# Patient Record
Sex: Female | Born: 1970 | Race: Black or African American | Hispanic: No | Marital: Married | Smoking: Never smoker
Health system: Southern US, Community
[De-identification: ages and names within clinical notes are randomized; demographics above are authoritative.]

## PROBLEM LIST (undated history)

## (undated) DIAGNOSIS — D259 Leiomyoma of uterus, unspecified: Secondary | ICD-10-CM

## (undated) DIAGNOSIS — E559 Vitamin D deficiency, unspecified: Secondary | ICD-10-CM

## (undated) HISTORY — DX: Vitamin D deficiency, unspecified: E55.9

## (undated) HISTORY — DX: Leiomyoma of uterus, unspecified: D25.9

---

## 2010-03-01 ENCOUNTER — Emergency Department (HOSPITAL_COMMUNITY): Admission: EM | Admit: 2010-03-01 | Discharge: 2010-03-01 | Payer: Self-pay | Admitting: Emergency Medicine

## 2010-09-04 LAB — URINE MICROSCOPIC-ADD ON

## 2010-09-04 LAB — URINALYSIS, ROUTINE W REFLEX MICROSCOPIC
Glucose, UA: NEGATIVE mg/dL
Ketones, ur: NEGATIVE mg/dL
pH: 6.5 (ref 5.0–8.0)

## 2010-09-04 LAB — RPR: RPR Ser Ql: NONREACTIVE

## 2010-10-14 ENCOUNTER — Emergency Department (HOSPITAL_COMMUNITY)
Admission: EM | Admit: 2010-10-14 | Discharge: 2010-10-14 | Disposition: A | Discharge status: Home / Self Care | Payer: Self-pay | Attending: Emergency Medicine | Admitting: Emergency Medicine

## 2010-10-14 DIAGNOSIS — N946 Dysmenorrhea, unspecified: Secondary | ICD-10-CM | POA: Insufficient documentation

## 2010-10-14 DIAGNOSIS — R109 Unspecified abdominal pain: Secondary | ICD-10-CM | POA: Insufficient documentation

## 2010-10-14 LAB — URINE MICROSCOPIC-ADD ON

## 2010-10-14 LAB — URINALYSIS, ROUTINE W REFLEX MICROSCOPIC
Nitrite: NEGATIVE
Specific Gravity, Urine: 1.025 (ref 1.005–1.030)
Urobilinogen, UA: 0.2 mg/dL (ref 0.0–1.0)

## 2010-10-14 LAB — POCT PREGNANCY, URINE: Preg Test, Ur: NEGATIVE

## 2010-10-20 ENCOUNTER — Encounter: Payer: Self-pay | Admitting: Obstetrics & Gynecology

## 2012-06-22 DIAGNOSIS — D259 Leiomyoma of uterus, unspecified: Secondary | ICD-10-CM

## 2012-06-22 HISTORY — DX: Leiomyoma of uterus, unspecified: D25.9

## 2013-01-26 ENCOUNTER — Ambulatory Visit: Payer: Self-pay

## 2013-09-26 DIAGNOSIS — E559 Vitamin D deficiency, unspecified: Secondary | ICD-10-CM

## 2013-09-26 HISTORY — DX: Vitamin D deficiency, unspecified: E55.9

## 2013-10-03 ENCOUNTER — Ambulatory Visit (INDEPENDENT_AMBULATORY_CARE_PROVIDER_SITE_OTHER): Payer: 59 | Admitting: Women's Health

## 2013-10-03 ENCOUNTER — Encounter: Payer: Self-pay | Admitting: Women's Health

## 2013-10-03 VITALS — BP 140/100 | Ht 63.0 in | Wt 216.0 lb

## 2013-10-03 DIAGNOSIS — N926 Irregular menstruation, unspecified: Secondary | ICD-10-CM

## 2013-10-03 DIAGNOSIS — D219 Benign neoplasm of connective and other soft tissue, unspecified: Secondary | ICD-10-CM

## 2013-10-03 DIAGNOSIS — Z01419 Encounter for gynecological examination (general) (routine) without abnormal findings: Secondary | ICD-10-CM

## 2013-10-03 DIAGNOSIS — D259 Leiomyoma of uterus, unspecified: Secondary | ICD-10-CM

## 2013-10-03 NOTE — Patient Instructions (Signed)
Fibroids Fibroids are lumps (tumors) that can occur any place in a woman's body. These lumps are not cancerous. Fibroids vary in size, weight, and where they grow. HOME CARE  Do not take aspirin.  Write down the number of pads or tampons you use during your period. Tell your doctor. This can help determine the best treatment for you. GET HELP RIGHT AWAY IF:  You have pain in your lower belly (abdomen) that is not helped with medicine.  You have cramps that are not helped with medicine.  You have more bleeding between or during your period.  You feel lightheaded or pass out (faint).  Your lower belly pain gets worse. MAKE SURE YOU:  Understand these instructions.  Will watch your condition.  Will get help right away if you are not doing well or get worse. Document Released: 07/11/2010 Document Revised: 08/31/2011 Document Reviewed: 07/11/2010 ExitCare Patient Information 2014 [employer].  

## 2013-10-03 NOTE — Progress Notes (Signed)
Erin Glenn 1971-02-16 [id number]    History:    Presents for problem and annual exam. History of normal Paps and baseline mammogram. Has had problems with irregular periods and amenorrhea. Reports numerous labs done at previous OB/GYN and was told it with a "fibroid causing the problem". Has regular monthly cycles when on birth control pills but would like to conceive, reports never conceiving when off OC's. Husband has 41 year old child. States had normal blood pressure, blood sugar, lipid panel, and CBC at primary care, will have labs and reports sent.   Past medical history, past surgical history, family history and social history were all reviewed and documented in the EPIC chart. Recently moved here from [place]. 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  Parents hypertension.   ROS:  A  ROS was performed and pertinent positives and negatives are included.  Exam:  Filed Vitals:   10/03/13 1447  BP: 140/100    General appearance:  Normal Thyroid:  Symmetrical, normal in size, without palpable masses or nodularity. Respiratory  Auscultation:  Clear without wheezing or rhonchi Cardiovascular  Auscultation:  Regular rate, without rubs, murmurs or gallops  Edema/varicosities:  Not grossly evident Abdominal  Soft,nontender, without masses, guarding or rebound.  Liver/spleen:  No organomegaly noted  Hernia:  None appreciated  Skin  Inspection:  Grossly normal   Breasts: Examined lying and sitting.     Right: Without masses, retractions, discharge or axillary adenopathy.     Left: Without masses, retractions, discharge or axillary adenopathy. Gentitourinary   Inguinal/mons:  Normal without inguinal adenopathy  External genitalia:  Normal  BUS/Urethra/Skene's glands:  Normal  Vagina:  Normal  Cervix:  Normal  Uterus:  8 weeks' size, shape and contour.  Midline and mobile  Adnexa/parametria:     Rt: Without masses or tenderness.   Lt: Without masses or  tenderness.  Anus and perineum: Normal  Digital rectal exam: Normal sphincter tone without palpated masses or tenderness  Assessment/Plan:  43 y.o. MBF G0 for annual exam.    Desiring conception History of fibroid  Obesity History of irregular cycles/amenorrhea  Name: Blood pressure elevated today, will check away from office, states normal last week and had labs at primary care. SBE's, annual screening mammogram, calcium rich diet, MVI daily encouraged. Schedule ultrasound to assess fibroids. Reviewed possibly irregular cycles causing more  Fertility issues than fibroids. Reviewed importance of increasing regular exercise and decreasing calories for weight loss. Pap. Will await lab and ultrasound reports from previous Dr.   Huel Cote [facility], 5:33 PM 10/03/2013

## 2013-10-04 ENCOUNTER — Other Ambulatory Visit (HOSPITAL_COMMUNITY)
Admission: RE | Admit: 2013-10-04 | Discharge: 2013-10-04 | Disposition: A | Discharge status: Home / Self Care | Payer: Self-pay | Source: Ambulatory Visit | Attending: Gynecology | Admitting: Gynecology

## 2013-10-04 DIAGNOSIS — Z01419 Encounter for gynecological examination (general) (routine) without abnormal findings: Secondary | ICD-10-CM | POA: Insufficient documentation

## 2013-10-04 NOTE — Addendum Note (Signed)
Addended by: Nelva Nay on: 10/04/2013 08:18 AM   Modules accepted: Orders

## 2013-10-11 ENCOUNTER — Ambulatory Visit (INDEPENDENT_AMBULATORY_CARE_PROVIDER_SITE_OTHER): Payer: 59 | Admitting: Women's Health

## 2013-10-11 ENCOUNTER — Other Ambulatory Visit: Payer: Self-pay | Admitting: Women's Health

## 2013-10-11 ENCOUNTER — Encounter: Payer: Self-pay | Admitting: Women's Health

## 2013-10-11 ENCOUNTER — Ambulatory Visit (INDEPENDENT_AMBULATORY_CARE_PROVIDER_SITE_OTHER): Payer: 59

## 2013-10-11 DIAGNOSIS — D252 Subserosal leiomyoma of uterus: Secondary | ICD-10-CM

## 2013-10-11 DIAGNOSIS — N852 Hypertrophy of uterus: Secondary | ICD-10-CM

## 2013-10-11 DIAGNOSIS — D219 Benign neoplasm of connective and other soft tissue, unspecified: Secondary | ICD-10-CM

## 2013-10-11 DIAGNOSIS — D259 Leiomyoma of uterus, unspecified: Secondary | ICD-10-CM | POA: Insufficient documentation

## 2013-10-11 DIAGNOSIS — D251 Intramural leiomyoma of uterus: Secondary | ICD-10-CM

## 2013-10-11 MED ORDER — NORETHIN ACE-ETH ESTRAD-FE 1-20 MG-MCG PO TABS: 1.0000 | ORAL_TABLET | Freq: Every day | ORAL | Status: DC

## 2013-10-11 NOTE — Patient Instructions (Signed)
Fibroids Fibroids are lumps (tumors) that can occur any place in a woman's body. These lumps are not cancerous. Fibroids vary in size, weight, and where they grow. HOME CARE  Do not take aspirin.  Write down the number of pads or tampons you use during your period. Tell your doctor. This can help determine the best treatment for you. GET HELP RIGHT AWAY IF:  You have pain in your lower belly (abdomen) that is not helped with medicine.  You have cramps that are not helped with medicine.  You have more bleeding between or during your period.  You feel lightheaded or pass out (faint).  Your lower belly pain gets worse. MAKE SURE YOU:  Understand these instructions.  Will watch your condition.  Will get help right away if you are not doing well or get worse. Document Released: 07/11/2010 Document Revised: 08/31/2011 Document Reviewed: 07/11/2010 ExitCare Patient Information 2014 [employer].  

## 2013-10-11 NOTE — Progress Notes (Signed)
Patient ID: Erin Glenn, female   DOB: March 13, 1971, 43 y.o.   MRN: [id number] Presents for ultrasound, history of asymptomatic fibroids. Married last year and contemplating pregnancy. Irregular cycles, OC's for cycle control in the past. Normal TSH and prolactin.  Ultrasound: Enlarged anteverted fibroid uterus 72 x 60 x 86 mm unable to ID endometrium, 35 x 28 x 37 mm subserous, 29 x 26 x 24 mm. Endometrium fluid-filled lower endometrium upper endometrium not visualized. Right ovary normal. Left ovary transabdominal image normal negative cul-de-sac.  Large Fibroid uterus.  Plan: Options reviewed, myomectomy versus hysterectomy. Discussed need to be out of work, care after  and states cannot be out greater than one to 2 weeks. At this time she would like to proceed with birth control pills, will try Loestrin 1/20 prescription, proper use given and reviewed, instructed to call if no cycle. Start first day of next cycle. Risks of blood clots and strokes reviewed. Instructed to call if would like to proceed to schedule appointment with Dr. Phineas Real to discuss options.

## 2013-11-24 ENCOUNTER — Telehealth: Payer: Self-pay | Admitting: *Deleted

## 2013-11-24 MED ORDER — NORETHIN-ETH ESTRAD-FE BIPHAS 1 MG-10 MCG / 10 MCG PO TABS: 1.0000 | ORAL_TABLET | Freq: Every day | ORAL | Status: DC

## 2013-11-24 NOTE — Telephone Encounter (Signed)
Pt was placed Loestrin 1/20 on 10/11/13 has stopped taking pills c/o dizziness while on them. No dizzy spells now, she asked if lower dose pill could be sent. Please advise

## 2013-11-24 NOTE — Telephone Encounter (Signed)
Could try lol oestrin fe 24, tell her to start Sunday after next cycle daily, reviewed if a 10 mcg estrogen. First month contraceptive,

## 2013-11-24 NOTE — Telephone Encounter (Signed)
Pt informed, rx sent 

## 2013-11-28 ENCOUNTER — Other Ambulatory Visit: Payer: Self-pay | Admitting: Women's Health

## 2013-11-28 ENCOUNTER — Telehealth: Payer: Self-pay

## 2013-11-28 MED ORDER — NORETHINDRONE 0.35 MG PO TABS: 1.0000 | ORAL_TABLET | Freq: Every day | ORAL | Status: DC

## 2013-11-28 NOTE — Telephone Encounter (Signed)
Pharmacy sent a message requesting "New Rx for Gildess FE, Microgestin FE or Junel FE per insurance". The Rx that they have is Lo Loestrin FE Tabs-28.

## 2013-11-28 NOTE — Telephone Encounter (Signed)
Telephone call to pt concerning pharmacy call.  States no longer takiing pills, made her dizzy, has large fibroids and amenorrhea, will try micronor, rx called in

## 2014-07-21 ENCOUNTER — Emergency Department (HOSPITAL_BASED_OUTPATIENT_CLINIC_OR_DEPARTMENT_OTHER)
Admission: EM | Admit: 2014-07-21 | Discharge: 2014-07-21 | Disposition: A | Discharge status: Home / Self Care | Payer: Self-pay | Attending: Emergency Medicine | Admitting: Emergency Medicine

## 2014-07-21 ENCOUNTER — Encounter (HOSPITAL_BASED_OUTPATIENT_CLINIC_OR_DEPARTMENT_OTHER): Payer: Self-pay | Admitting: Emergency Medicine

## 2014-07-21 ENCOUNTER — Emergency Department (HOSPITAL_BASED_OUTPATIENT_CLINIC_OR_DEPARTMENT_OTHER): Payer: Self-pay

## 2014-07-21 DIAGNOSIS — Z8639 Personal history of other endocrine, nutritional and metabolic disease: Secondary | ICD-10-CM | POA: Insufficient documentation

## 2014-07-21 DIAGNOSIS — B349 Viral infection, unspecified: Secondary | ICD-10-CM | POA: Insufficient documentation

## 2014-07-21 DIAGNOSIS — Z793 Long term (current) use of hormonal contraceptives: Secondary | ICD-10-CM | POA: Insufficient documentation

## 2014-07-21 DIAGNOSIS — Z8742 Personal history of other diseases of the female genital tract: Secondary | ICD-10-CM | POA: Insufficient documentation

## 2014-07-21 MED ORDER — ACETAMINOPHEN 325 MG PO TABS
ORAL_TABLET | ORAL | Status: AC
  Filled 2014-07-21: qty 2

## 2014-07-21 MED ORDER — ACETAMINOPHEN 325 MG PO TABS
650.0000 mg | ORAL_TABLET | Freq: Once | ORAL | Status: AC
  Administered 2014-07-21: 650 mg via ORAL

## 2014-07-21 NOTE — ED Provider Notes (Signed)
CSN: [id number]     Arrival date & time 07/21/14  0848 History   First MD Initiated Contact with Patient 07/21/14 [phone]     Chief Complaint  Patient presents with  . Cough  . Facial Pain     (Consider location/radiation/quality/duration/timing/severity/associated sxs/prior Treatment) HPI  Pt presenting with c/o headache, as well as fever.  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  She has some mild cough.  Frontal headache is throbbing.  Denies having nasal congestion or sore throat.  She states she has been taking theraflu and mucinex at home.  Has continued to drink liquids well.  No abodminal pain, no vomiting/diarrhea.  No dysuria.  She states her face feels hot due to having fever.  There are no other associated systemic symptoms, there are no other alleviating or modifying factors.   Past Medical History  Diagnosis Date  . Unspecified vitamin D deficiency 09/26/2013    [facility] care [facility]  . Fibroid uterus 2014   History reviewed. No pertinent past surgical history. Family History  Problem Relation Age of Onset  . Diabetic kidney disease Mother   . Hypertension Mother   . Alcoholism Father   . Hypertension Father   . Cancer - Other Father 15    throat   History  Substance Use Topics  . Smoking status: Never Smoker   . Smokeless tobacco: Not on file  . Alcohol Use: No   OB History    No data available     Review of Systems  ROS reviewed and all otherwise negative except for mentioned in HPI    Allergies  Review of patient's allergies indicates no known allergies.  Home Medications   Prior to Admission medications   Medication Sig Start Date End Date Taking? Authorizing Provider  norethindrone (ORTHO MICRONOR) 0.35 MG tablet Take 1 tablet (0.35 mg total) by mouth daily. 11/28/13   Huel Cote, NP   BP 123/73 mmHg  Pulse 100  Temp(Src) 100.2 F (37.9 C) (Oral)  Resp 20  Ht 5\' 4"  (1.626 m)  Wt 220 lb (99.791  kg)  BMI 37.74 kg/m2  SpO2 95%  LMP 07/07/2014 (Approximate)  Vitals reviewed Physical Exam  Physical Examination: General appearance - alert, well appearing, and in no distress Mental status - alert, oriented to person, place, and time Eyes - no conjunctival injection, no scleral icterus Mouth - mucous membranes moist, pharynx normal without lesions Neck - supple, no significant adenopathy Chest - clear to auscultation, no wheezes, rales or rhonchi, symmetric air entry Heart - normal rate, regular rhythm, normal S1, S2, no murmurs, rubs, clicks or gallops Extremities - peripheral pulses normal, no pedal edema, no clubbing or cyanosis Skin - normal coloration and turgor, no rashes  ED Course  Procedures (including critical care time) Labs Review Labs Reviewed - No data to display  Imaging Review Dg Chest 2 View  07/21/2014   CLINICAL DATA:  44 year old female with chest pain, cough and fever since Thursday.  EXAM: CHEST  2 VIEW  COMPARISON:  No priors.  FINDINGS: Lung volumes are normal. No consolidative airspace disease. No pleural effusions. No pneumothorax. No pulmonary nodule or mass noted. Pulmonary vasculature and the cardiomediastinal silhouette are within normal limits. Externum No radiographic evidence of acute cardiopulmonary disease.  IMPRESSION: No active cardiopulmonary disease.   Electronically Signed   By: Vinnie Langton M.D.   On: 07/21/2014 09:17     EKG Interpretation None  MDM   Final diagnoses:  Viral infection   Pt presenting with cough, frontal headache, with fever.  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  Pt has likely viral infection.  No signs of serious bacterial infection at this time.  CXR reassuring.   Xray images reviewed and interpreted by me as well. Nursing notes including past medical history and social history reviewed and considered in documentation.  Discharged with strict return precautions.  Pt  agreeable with plan.      Threasa Beards, MD 07/21/14 1310

## 2014-07-21 NOTE — ED Notes (Signed)
Patient states that she has facial pain with cough and fever. The patient states that her client recently was treated for a cold. The patient reports that she has taken multiple OTC medication to help

## 2014-07-21 NOTE — Discharge Instructions (Signed)
Return to the ED with any concerns including difficulty breathing, vomiting and not able to keep down liquids, decreased level of alertness/lethargy, or any other alarming symptoms °

## 2015-07-09 ENCOUNTER — Ambulatory Visit (INDEPENDENT_AMBULATORY_CARE_PROVIDER_SITE_OTHER): Payer: BLUE CROSS/BLUE SHIELD | Admitting: Family Medicine

## 2015-07-09 VITALS — BP 142/100 | HR 82 | Temp 98.3°F | Resp 16 | Ht 64.0 in | Wt 245.0 lb

## 2015-07-09 DIAGNOSIS — R002 Palpitations: Secondary | ICD-10-CM

## 2015-07-09 DIAGNOSIS — Z131 Encounter for screening for diabetes mellitus: Secondary | ICD-10-CM | POA: Diagnosis not present

## 2015-07-09 DIAGNOSIS — R42 Dizziness and giddiness: Secondary | ICD-10-CM | POA: Diagnosis not present

## 2015-07-09 DIAGNOSIS — R635 Abnormal weight gain: Secondary | ICD-10-CM

## 2015-07-09 DIAGNOSIS — I10 Essential (primary) hypertension: Secondary | ICD-10-CM | POA: Diagnosis not present

## 2015-07-09 LAB — POCT GLYCOSYLATED HEMOGLOBIN (HGB A1C): Hemoglobin A1C: 6

## 2015-07-09 LAB — POCT CBC
Granulocyte percent: 64.9 %G (ref 37–80)
HCT, POC: 36 % — AB (ref 37.7–47.9)
Hemoglobin: 12.2 g/dL (ref 12.2–16.2)
Lymph, poc: 2.7 (ref 0.6–3.4)
MCH, POC: 25.3 pg — AB (ref 27–31.2)
MCHC: 33.8 g/dL (ref 31.8–35.4)
MCV: 74.8 fL — AB (ref 80–97)
MID (cbc): 0.3 (ref 0–0.9)
MPV: 7.7 fL (ref 0–99.8)
POC Granulocyte: 5.6 (ref 2–6.9)
POC LYMPH PERCENT: 31.3 % (ref 10–50)
POC MID %: 3.8 % (ref 0–12)
Platelet Count, POC: 316 10*3/uL (ref 142–424)
RBC: 4.81 M/uL (ref 4.04–5.48)
RDW, POC: 18.5 %
WBC: 8.6 10*3/uL (ref 4.6–10.2)

## 2015-07-09 MED ORDER — HYDROCHLOROTHIAZIDE 25 MG PO TABS: ORAL_TABLET | ORAL | Status: DC

## 2015-07-09 NOTE — Progress Notes (Signed)
Chief Complaint:  Chief Complaint  Patient presents with  . Hypertension  . Dizziness    HPI: Erin Glenn is a 45 y.o. female who reports to [facility] today complaining of  htn and dizziness. She was on hctz 12.5 mg daily. NO CP , but had palpitations x 1  She deneis HA, vision changes, n/v/abd pain, n/w/t , CVA like sxs, gait changes.  She was workign out and was on the ellipitical and treadmill and felt dizzy. No hx of vertigo or dizziness She has not been on her BP medication on HCTZ 12.5 mg since she lost weigth, she is taking dietary supplent garcia combodgia which is new, denies caffeine or phentermine use of any anabolic steroids. She was doing well with weight loss and then gained it back  She deos have anemai and has  Uterine fobroids, LMP was light last month, and also short. However before than was heavy and lasted 5 days  Denies any yrianry sxs, no fevers , chills, syuria or back pain   Past Medical History  Diagnosis Date  . Unspecified vitamin D deficiency 09/26/2013    urgent care at [facility]  . Fibroid uterus 2014   History reviewed. No pertinent past surgical history. Social History   Social History  . Marital Status: Single    Spouse Name: N/A  . Number of Children: N/A  . Years of Education: N/A   Social History Main Topics  . Smoking status: Never Smoker   . Smokeless tobacco: None  . Alcohol Use: No  . Drug Use: No  . Sexual Activity: Yes    Birth Control/ Protection: None     Comment: G0 P0   Other Topics Concern  . None   Social History Narrative   Family History  Problem Relation Age of Onset  . Diabetic kidney disease Mother   . Hypertension Mother   . Alcoholism Father   . Hypertension Father   . Cancer - Other Father 69    throat   No Known Allergies Prior to Admission medications   Medication Sig Start Date End Date Taking? Authorizing Provider  norethindrone (ORTHO MICRONOR) 0.35 MG tablet Take 1 tablet (0.35 mg total)  by mouth daily. Patient not taking: Reported on 07/09/2015 11/28/13   Huel Cote, NP     ROS: The patient denies fevers, chills, night sweats, unintentional weight loss, chest pain, palpitations, wheezing, dyspnea on exertion, nausea, vomiting, abdominal pain, dysuria, hematuria, melena, numbness, weakness, or tingling.   All other systems have been reviewed and were otherwise negative with the exception of those mentioned in the HPI and as above.    PHYSICAL EXAM: Filed Vitals:   07/09/15 2017  BP: 142/100  Pulse: 82  Temp: 98.3 F (36.8 C)  Resp: 16   Body mass index is 42.03 kg/(m^2).   General: Alert, no acute distress HEENT:  Normocephalic, atraumatic, oropharynx patent. EOMI, PERRLA Fundo exam and Tm normal Cardiovascular:  Regular rate and rhythm, no rubs murmurs or gallops.  No Carotid bruits, radial pulse intact. No pedal edema.  Respiratory: Clear to auscultation bilaterally.  No wheezes, rales, or rhonchi.  No cyanosis, no use of accessory musculature Abdominal: No organomegaly, abdomen is soft and non-tender, positive bowel sounds. No masses. Skin: No rashes. Neurologic: Facial musculature symmetric. Cn 2-12 grossly nl, 5/5 sterngth, 2/2 DTRS, neg ROmberg, neg DI xHallpike Psychiatric: Patient acts appropriately throughout our interaction. Lymphatic: No cervical or submandibular lymphadenopathy Musculoskeletal: Gait intact. No edema,  tenderness   LABS: Results for orders placed or performed in visit on 07/09/15  POCT CBC  Result Value Ref Range   WBC 8.6 4.6 - 10.2 K/uL   Lymph, poc 2.7 0.6 - 3.4   POC LYMPH PERCENT 31.3 10 - 50 %L   MID (cbc) 0.3 0 - 0.9   POC MID % 3.8 0 - 12 %M   POC Granulocyte 5.6 2 - 6.9   Granulocyte percent 64.9 37 - 80 %G   RBC 4.81 4.04 - 5.48 M/uL   Hemoglobin 12.2 12.2 - 16.2 g/dL   HCT, POC 36.0 (A) 37.7 - 47.9 %   MCV 74.8 (A) 80 - 97 fL   MCH, POC 25.3 (A) 27 - 31.2 pg   MCHC 33.8 31.8 - 35.4 g/dL   RDW, POC 18.5 %    Platelet Count, POC 316 142 - 424 K/uL   MPV 7.7 0 - 99.8 fL     EKG/XRAY:   Primary read interpreted by Dr. Marin Comment at [facility].   ASSESSMENT/PLAN: Encounter Diagnoses  Name Primary?  . Essential hypertension Yes  . Palpitations   . Weight gain    Labs pending, orthostatics normal  Ekg showed nonspecific changes Fu with labs, refer to cardiology prn She will stop diet supplements, monitor sxs, precautions given to go to ER prn    Gross sideeffects, risk and benefits, and alternatives of medications d/w patient. Patient is aware that all medications have potential sideeffects and we are unable to predict every sideeffect or drug-drug interaction that may occur.  Jolanda Mccann DO  07/09/2015 9:21 PM

## 2015-07-10 LAB — COMPLETE METABOLIC PANEL WITHOUT GFR
ALT: 19 U/L (ref 6–29)
AST: 18 U/L (ref 10–30)
BUN: 9 mg/dL (ref 7–25)
CO2: 26 mmol/L (ref 20–31)
Calcium: 9.4 mg/dL (ref 8.6–10.2)
Chloride: 103 mmol/L (ref 98–110)
Creat: 0.65 mg/dL (ref 0.50–1.10)
Glucose, Bld: 93 mg/dL (ref 65–99)
Sodium: 138 mmol/L (ref 135–146)

## 2015-07-10 LAB — COMPLETE METABOLIC PANEL WITH GFR
Albumin: 3.9 g/dL (ref 3.6–5.1)
Alkaline Phosphatase: 70 U/L (ref 33–115)
GFR, Est African American: >89 mL/min (ref >=60)
GFR, Est Non African American: >89 mL/min (ref >=60)
Potassium: 4 mmol/L (ref 3.5–5.3)
Total Bilirubin: 0.2 mg/dL (ref 0.2–1.2)
Total Protein: 7.3 g/dL (ref 6.1–8.1)

## 2015-07-10 LAB — TSH: TSH: 3.179 u[IU]/mL (ref 0.350–4.500)

## 2015-07-11 ENCOUNTER — Telehealth: Payer: Self-pay

## 2015-07-11 NOTE — Telephone Encounter (Signed)
Pt called requesting lab results.  Call [phone]

## 2015-07-12 NOTE — Telephone Encounter (Signed)
Please review and send to lab pool.

## 2015-07-13 DIAGNOSIS — I10 Essential (primary) hypertension: Secondary | ICD-10-CM | POA: Insufficient documentation

## 2015-07-13 NOTE — Telephone Encounter (Signed)
Spoke with patient, will add Vit D level. She had appt with ob/gyn for fibroids next week will discuss. BP has been good highest was 136/90. But prior to that running around mid 120s/70s. She will try getting on elliptical and see if dizzy We did not collect urine last itme since she did not have dysuria or back pain, n/v. She has stopped taking garcinia cambodgia and dizziness seems to be slightly better. Recheck prn . Decline referral to cardiology at this time. Will let me know.

## 2015-08-03 ENCOUNTER — Telehealth: Payer: Self-pay

## 2015-08-03 NOTE — Telephone Encounter (Signed)
Pt has been taking hydrochlorothiazide (HYDRODIURIL) 25 MG tablet as prescribed by Dr. Marin Comment. She states she has had a hard time keeping her blood pressure under control for the last few weeks, so she has been taking two of the pills daily; As a result, the pt is completely out of this medication and needs refills. She would also like to try a higher dose.  bp range over last few weeks 170/101-140/90

## 2015-08-05 ENCOUNTER — Telehealth: Payer: Self-pay

## 2015-08-05 NOTE — Telephone Encounter (Signed)
Pt is checking on her blood pressure medication request that she states was on a thurdsay and only information I see is for 08/03/15 Pt is very anxoius to get this prescription   Best number [phone]

## 2015-08-06 MED ORDER — HYDROCHLOROTHIAZIDE 25 MG PO TABS: ORAL_TABLET | ORAL | Status: DC

## 2015-08-06 MED ORDER — LISINOPRIL 10 MG PO TABS: 10.0000 mg | ORAL_TABLET | Freq: Every day | ORAL | Status: DC

## 2015-08-06 NOTE — Telephone Encounter (Signed)
Spoke wit patient, her BP is not terrible, she has high distaloic, going through menopause, has fibroids but 2/3 are shrinking. Will get recent labs from ob gyn sent to [place], she had normal CBC and cmp from last ov. She denies dizziness, CP . Will rx lisnopril 10 mg daily and also hctz 12.5 to 25 mg daily. She iwll monitor BP . Fu in 2-4 weeks.

## 2015-08-06 NOTE — Telephone Encounter (Signed)
Pt called again 2/13 anxious about getting rx.  Dr. Marin Comment, please advise.

## 2015-10-10 ENCOUNTER — Telehealth: Payer: Self-pay

## 2015-10-10 NOTE — Telephone Encounter (Signed)
Patient is calling to request a refill for lasix generic.   [facility] [place]

## 2015-10-11 ENCOUNTER — Ambulatory Visit (INDEPENDENT_AMBULATORY_CARE_PROVIDER_SITE_OTHER): Payer: BLUE CROSS/BLUE SHIELD | Admitting: Family Medicine

## 2015-10-11 VITALS — BP 110/60 | HR 73 | Temp 97.8°F | Resp 14 | Ht 64.0 in | Wt 244.2 lb

## 2015-10-11 DIAGNOSIS — R600 Localized edema: Secondary | ICD-10-CM | POA: Diagnosis not present

## 2015-10-11 MED ORDER — FUROSEMIDE 20 MG PO TABS: 20.0000 mg | ORAL_TABLET | Freq: Every day | ORAL | Status: DC

## 2015-10-11 NOTE — Patient Instructions (Addendum)
[facility] in [place], [place]  Dr. Linus Mako for the vein therapy on [address].    IF you received an x-ray today, you will receive an invoice from [facility]. Please contact [facility] at [phone] with questions or concerns regarding your invoice.   IF you received labwork today, you will receive an invoice from [facility] [employer]. Please contact [facility] at [phone] with questions or concerns regarding your invoice.   Our billing staff will not be able to assist you with questions regarding bills from these companies.  You will be contacted with the lab results as soon as they are available. The fastest way to get your results is to activate your My Chart account. Instructions are located on the last page of this paperwork. If you have not heard from [place] regarding the results in 2 weeks, please contact this office.

## 2015-10-11 NOTE — Progress Notes (Signed)
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  She complains of pedal edema.  She is working out and doing [employer].  Objective:  NAD BP 110/60 mmHg  Pulse 73  Temp(Src) 97.8 F (36.6 C) (Oral)  Resp 14  Ht 5\' 4"  (1.626 m)  Wt 244 lb 3.2 oz (110.768 kg)  BMI 41.90 kg/m2  SpO2 97% 1+ pedal edema Results for orders placed or performed in visit on 07/09/15  COMPLETE METABOLIC PANEL WITH GFR  Result Value Ref Range   Sodium 138 135 - 146 mmol/L   Potassium 4.0 3.5 - 5.3 mmol/L   Chloride 103 98 - 110 mmol/L   CO2 26 20 - 31 mmol/L   Glucose, Bld 93 65 - 99 mg/dL   BUN 9 7 - 25 mg/dL   Creat 0.65 0.50 - 1.10 mg/dL   Total Bilirubin 0.2 0.2 - 1.2 mg/dL   Alkaline Phosphatase 70 33 - 115 U/L   AST 18 10 - 30 U/L   ALT 19 6 - 29 U/L   Total Protein 7.3 6.1 - 8.1 g/dL   Albumin 3.9 3.6 - 5.1 g/dL   Calcium 9.4 8.6 - 10.2 mg/dL   GFR, Est African American >89 >=60 mL/min   GFR, Est Non African American >89 >=60 mL/min  TSH  Result Value Ref Range   TSH 3.179 0.350 - 4.500 uIU/mL  POCT CBC  Result Value Ref Range   WBC 8.6 4.6 - 10.2 K/uL   Lymph, poc 2.7 0.6 - 3.4   POC LYMPH PERCENT 31.3 10 - 50 %L   MID (cbc) 0.3 0 - 0.9   POC MID % 3.8 0 - 12 %M   POC Granulocyte 5.6 2 - 6.9   Granulocyte percent 64.9 37 - 80 %G   RBC 4.81 4.04 - 5.48 M/uL   Hemoglobin 12.2 12.2 - 16.2 g/dL   HCT, POC 36.0 (A) 37.7 - 47.9 %   MCV 74.8 (A) 80 - 97 fL   MCH, POC 25.3 (A) 27 - 31.2 pg   MCHC 33.8 31.8 - 35.4 g/dL   RDW, POC 18.5 %   Platelet Count, POC 316 142 - 424 K/uL   MPV 7.7 0 - 99.8 fL  POCT glycosylated hemoglobin (Hb A1C)  Result Value Ref Range   Hemoglobin A1C 6.0      Assessment:  Mild pedal edema and obesity with spider veins on legs  Plan:  Pedal edema - Plan: furosemide (LASIX) 20 MG tablet  Robyn Haber, MD

## 2015-10-14 NOTE — Telephone Encounter (Signed)
Pt was seen

## 2015-11-12 ENCOUNTER — Ambulatory Visit (INDEPENDENT_AMBULATORY_CARE_PROVIDER_SITE_OTHER): Payer: BLUE CROSS/BLUE SHIELD | Admitting: Family Medicine

## 2015-11-12 VITALS — BP 122/70 | HR 70 | Temp 98.1°F | Resp 18 | Ht 64.0 in | Wt 243.0 lb

## 2015-11-12 DIAGNOSIS — Z111 Encounter for screening for respiratory tuberculosis: Secondary | ICD-10-CM

## 2015-11-12 NOTE — Progress Notes (Signed)

## 2015-11-12 NOTE — Patient Instructions (Signed)
     IF you received an x-ray today, you will receive an invoice from [facility]. Please contact [facility] at [phone] with questions or concerns regarding your invoice.   IF you received labwork today, you will receive an invoice from [facility]. Please contact [facility] at [phone] with questions or concerns regarding your invoice.   Our billing staff will not be able to assist you with questions regarding bills from these companies.  You will be contacted with the lab results as soon as they are available. The fastest way to get your results is to activate your My Chart account. Instructions are located on the last page of this paperwork. If you have not heard from us regarding the results in 2 weeks, please contact this office.      

## 2015-11-15 ENCOUNTER — Ambulatory Visit (INDEPENDENT_AMBULATORY_CARE_PROVIDER_SITE_OTHER): Payer: BLUE CROSS/BLUE SHIELD | Admitting: *Deleted

## 2015-11-15 DIAGNOSIS — Z111 Encounter for screening for respiratory tuberculosis: Secondary | ICD-10-CM

## 2015-11-15 LAB — TB SKIN TEST
INDURATION: 0 mm
TB Skin Test: NEGATIVE

## 2015-11-19 NOTE — Progress Notes (Signed)
   Subjective:    Patient ID: Erin Glenn, female    DOB: 11-14-1970, 45 y.o.   MRN: [id number] Chief Complaint  Patient presents with  . PPD Reading    tb placement    HPI Needs ppd for work. Past Medical History  Diagnosis Date  . Unspecified vitamin D deficiency 09/26/2013    urgent care at [facility]  . Fibroid uterus 2014   Current Outpatient Prescriptions on File Prior to Visit  Medication Sig Dispense Refill  . furosemide (LASIX) 20 MG tablet Take 1 tablet (20 mg total) by mouth daily. 30 tablet 3  . hydrochlorothiazide (HYDRODIURIL) 25 MG tablet Take 1/2 tab po daily , may increase to 1 tab daily if BP is >140/90 30 tablet 1   No current facility-administered medications on file prior to visit.   No Known Allergies     Review of Systems  Constitutional: Negative for fever, chills, diaphoresis, activity change, appetite change and fatigue.  Respiratory: Negative for cough and shortness of breath.   Cardiovascular: Negative for chest pain.  Hematological: Negative for adenopathy.       Objective:  BP 122/70 mmHg  Pulse 70  Temp(Src) 98.1 F (36.7 C) (Oral)  Resp 18  Ht 5\' 4"  (1.626 m)  Wt 243 lb (110.224 kg)  BMI 41.69 kg/m2  SpO2 95%  LMP 10/15/2015  Physical Exam  Constitutional: She is oriented to person, place, and time. She appears well-developed and well-nourished. No distress.  HENT:  Head: Normocephalic and atraumatic.  Right Ear: External ear normal.  Eyes: Conjunctivae are normal. No scleral icterus.  Pulmonary/Chest: Effort normal.  Neurological: She is alert and oriented to person, place, and time.  Skin: Skin is warm and dry. She is not diaphoretic. No erythema.  Psychiatric: She has a normal mood and affect. Her behavior is normal.          Assessment & Plan:   1. Screening for tuberculosis   for work. No h/o abnml   Orders Placed This Encounter  Procedures  . TB Skin Test    Order Specific Question:  Has patient ever  tested positive?    Answer:  No      Delman Cheadle, MD MPH

## 2015-12-24 ENCOUNTER — Emergency Department (HOSPITAL_BASED_OUTPATIENT_CLINIC_OR_DEPARTMENT_OTHER)
Admission: EM | Admit: 2015-12-24 | Discharge: 2015-12-24 | Disposition: A | Discharge status: Home / Self Care | Payer: Self-pay | Attending: Emergency Medicine | Admitting: Emergency Medicine

## 2015-12-24 ENCOUNTER — Encounter (HOSPITAL_BASED_OUTPATIENT_CLINIC_OR_DEPARTMENT_OTHER): Payer: Self-pay | Admitting: Emergency Medicine

## 2015-12-24 DIAGNOSIS — K029 Dental caries, unspecified: Secondary | ICD-10-CM | POA: Insufficient documentation

## 2015-12-24 MED ORDER — HYDROCODONE-ACETAMINOPHEN 5-325 MG PO TABS: 1.0000 | ORAL_TABLET | Freq: Four times a day (QID) | ORAL | Status: DC | PRN

## 2015-12-24 MED ORDER — PENICILLIN V POTASSIUM 500 MG PO TABS: 500.0000 mg | ORAL_TABLET | Freq: Three times a day (TID) | ORAL | Status: DC

## 2015-12-24 NOTE — ED Provider Notes (Signed)
CSN: [id number]     Arrival date & time 12/24/15  [id number] History   First MD Initiated Contact with Patient 12/24/15 [phone]     Chief Complaint  Patient presents with  . Dental Pain     (Consider location/radiation/quality/duration/timing/severity/associated sxs/prior Treatment) HPI Comments: Patient is a 45 year old female who presents with complaints of dental pain. She has multiple caries and is due to see her dentist tomorrow. The pain became worse and she presents for evaluation of this.  Patient is a 45 y.o. female presenting with tooth pain. The history is provided by the patient.  Dental Pain Location:  Lower Lower teeth location:  18/LL 2nd molar and 19/LL 1st molar Quality:  Throbbing Severity:  Moderate Onset quality:  Sudden Timing:  Constant Progression:  Worsening Chronicity:  Chronic Context: poor dentition     Past Medical History  Diagnosis Date  . Unspecified vitamin D deficiency 09/26/2013    urgent care at [facility]  . Fibroid uterus 2014   No past surgical history on file. Family History  Problem Relation Age of Onset  . Diabetic kidney disease Mother   . Hypertension Mother   . Alcoholism Father   . Hypertension Father   . Cancer - Other Father 79    throat   Social History  Substance Use Topics  . Smoking status: Never Smoker   . Smokeless tobacco: None  . Alcohol Use: No   OB History    No data available     Review of Systems  All other systems reviewed and are negative.     Allergies  Review of patient's allergies indicates no known allergies.  Home Medications   Prior to Admission medications   Medication Sig Start Date End Date Taking? Authorizing Provider  furosemide (LASIX) 20 MG tablet Take 1 tablet (20 mg total) by mouth daily. 10/11/15   Robyn Haber, MD  hydrochlorothiazide (HYDRODIURIL) 25 MG tablet Take 1/2 tab po daily , may increase to 1 tab daily if BP is >140/90 08/06/15   Thao P Le, DO   BP 130/95 mmHg  Pulse 74   Temp(Src) 98.6 F (37 C) (Oral)  Resp 16  Ht 5\' 4"  (1.626 m)  Wt 243 lb (110.224 kg)  BMI 41.69 kg/m2  SpO2 99%  LMP 11/24/2015 Physical Exam  Constitutional: She is oriented to person, place, and time. She appears well-developed and well-nourished. No distress.  HENT:  Head: Normocephalic and atraumatic.  There are multiple heavily decayed teeth. The left lower molars are mostly missing. There is some swelling and inflammation of the gingival tissue, however no obvious abscess. There is no crepitus or trismus.  Neck: Normal range of motion. Neck supple.  Neurological: She is alert and oriented to person, place, and time.  Skin: Skin is warm and dry. She is not diaphoretic.  Nursing note reviewed.   ED Course  Procedures (including critical care time) Labs Review Labs Reviewed - No data to display  Imaging Review No results found. I have personally reviewed and evaluated these images and lab results as part of my medical decision-making.   EKG Interpretation None      MDM   Final diagnoses:  None    We'll treat with antibiotics and pain meds. She is to see her dentist tomorrow as scheduled.    Veryl Speak, MD 12/24/15 [phone]

## 2015-12-24 NOTE — Discharge Instructions (Signed)
Penicillin as prescribed.  Hydrocodone as prescribed as needed for pain.  See your dentist tomorrow as scheduled.   Dental Caries Dental caries (also called tooth decay) is the most common oral disease. It can occur at any age but is more common in children and young adults.  HOW DENTAL CARIES DEVELOPS  The process of decay begins when bacteria and foods (particularly sugars and starches) combine in your mouth to produce plaque. Plaque is a substance that sticks to the hard, outer surface of a tooth (enamel). The bacteria in plaque produce acids that attack enamel. These acids may also attack the root surface of a tooth (cementum) if it is exposed. Repeated attacks dissolve these surfaces and create holes in the tooth (cavities). If left untreated, the acids destroy the other layers of the tooth.  RISK FACTORS  Frequent sipping of sugary beverages.   Frequent snacking on sugary and starchy foods, especially those that easily get stuck in the teeth.   Poor oral hygiene.   Dry mouth.   Substance abuse such as methamphetamine abuse.   Broken or poor-fitting dental restorations.   Eating disorders.   Gastroesophageal reflux disease (GERD).   Certain radiation treatments to the head and neck. SYMPTOMS In the early stages of dental caries, symptoms are seldom present. Sometimes white, chalky areas may be seen on the enamel or other tooth layers. In later stages, symptoms may include:  Pits and holes on the enamel.  Toothache after sweet, hot, or cold foods or drinks are consumed.  Pain around the tooth.  Swelling around the tooth. DIAGNOSIS  Most of the time, dental caries is detected during a regular dental checkup. A diagnosis is made after a thorough medical and dental history is taken and the surfaces of your teeth are checked for signs of dental caries. Sometimes special instruments, such as lasers, are used to check for dental caries. Dental X-ray exams may be taken  so that areas not visible to the eye (such as between the contact areas of the teeth) can be checked for cavities.  TREATMENT  If dental caries is in its early stages, it may be reversed with a fluoride treatment or an application of a remineralizing agent at the dental office. Thorough brushing and flossing at home is needed to aid these treatments. If it is in its later stages, treatment depends on the location and extent of tooth destruction:   If a small area of the tooth has been destroyed, the destroyed area will be removed and cavities will be filled with a material such as gold, silver amalgam, or composite resin.   If a large area of the tooth has been destroyed, the destroyed area will be removed and a cap (crown) will be fitted over the remaining tooth structure.   If the center part of the tooth (pulp) is affected, a procedure called a root canal will be needed before a filling or crown can be placed.   If most of the tooth has been destroyed, the tooth may need to be pulled (extracted). HOME CARE INSTRUCTIONS You can prevent, stop, or reverse dental caries at home by practicing good oral hygiene. Good oral hygiene includes:  Thoroughly cleaning your teeth at least twice a day with a toothbrush and dental floss.   Using a fluoride toothpaste. A fluoride mouth rinse may also be used if recommended by your dentist or health care provider.   Restricting the amount of sugary and starchy foods and sugary liquids you consume.  Avoiding frequent snacking on these foods and sipping of these liquids.   Keeping regular visits with a dentist for checkups and cleanings. PREVENTION   Practice good oral hygiene.  Consider a dental sealant. A dental sealant is a coating material that is applied by your dentist to the pits and grooves of teeth. The sealant prevents food from being trapped in them. It may protect the teeth for several years.  Ask about fluoride supplements if you live  in a community without fluorinated water or with water that has a low fluoride content. Use fluoride supplements as directed by your dentist or health care provider.  Allow fluoride varnish applications to teeth if directed by your dentist or health care provider.   This information is not intended to replace advice given to you by your health care provider. Make sure you discuss any questions you have with your health care provider.   Document Released: 02/28/2002 Document Revised: 06/29/2014 Document Reviewed: 06/10/2012 Elsevier Interactive Patient Education [employer].

## 2015-12-24 NOTE — ED Notes (Signed)
Left lower dental pain since yesterday, unable to see dentist until tomorrow.

## 2016-03-24 ENCOUNTER — Other Ambulatory Visit: Payer: Self-pay | Admitting: Family Medicine

## 2016-03-24 DIAGNOSIS — R600 Localized edema: Secondary | ICD-10-CM

## 2016-03-25 NOTE — Telephone Encounter (Signed)
Last exam 06/2015 with lab. 10/2015 with Dr Brigitte Pulse for TB screen

## 2016-03-27 NOTE — Telephone Encounter (Signed)
Needs OV for  labs while ON the lasix. Sent a refill to her pharmacy but please encourage pt to go ahead and sched to be seen before she runs out.

## 2016-03-30 NOTE — Telephone Encounter (Signed)
Called pt to explain need for OV /labs while on the lasix. Pt agreed.

## 2016-06-09 ENCOUNTER — Emergency Department (HOSPITAL_BASED_OUTPATIENT_CLINIC_OR_DEPARTMENT_OTHER)
Admission: EM | Admit: 2016-06-09 | Discharge: 2016-06-09 | Disposition: A | Discharge status: Home / Self Care | Payer: Self-pay | Attending: Emergency Medicine | Admitting: Emergency Medicine

## 2016-06-09 ENCOUNTER — Encounter (HOSPITAL_BASED_OUTPATIENT_CLINIC_OR_DEPARTMENT_OTHER): Payer: Self-pay | Admitting: *Deleted

## 2016-06-09 DIAGNOSIS — I10 Essential (primary) hypertension: Secondary | ICD-10-CM | POA: Insufficient documentation

## 2016-06-09 DIAGNOSIS — Z760 Encounter for issue of repeat prescription: Secondary | ICD-10-CM | POA: Insufficient documentation

## 2016-06-09 MED ORDER — HYDROCHLOROTHIAZIDE 25 MG PO TABS: 12.5000 mg | ORAL_TABLET | Freq: Every day | ORAL | 0 refills | Status: DC

## 2016-06-09 NOTE — Discharge Instructions (Signed)
Your medication was refilled today as a one time courtesy, but it's very important that you follow up with your primary care doctor for ongoing medication refills. Eat a low salt diet to help with your blood pressure. Follow up with your primary care doctor in 1-2 weeks for ongoing management of your medical conditions. Return to the ER for changes or worsening symptoms.

## 2016-06-09 NOTE — ED Provider Notes (Signed)
[facility] Provider Note   CSN: [id number] Arrival date & time: 06/09/16  1052     History   Chief Complaint Chief Complaint  Patient presents with  . Hypertension    HPI Erin Glenn is a 45 y.o. female with a PMHx of HTN, vit D deficiency, and uterine fibroids, who presents to the ED with complaints of being out of her HCTZ prescription and needing a refill. Patient states that she ran out of her medication 3 weeks ago, states that she supposed to take HCTZ 12.5 mg daily PRN if her DBP is >90, per Dr. Marin Comment (her prior PCP, although she's no longer in practice here so pt hasn't seen a PCP in a few months). She states that she takes it only as needed when her blood pressure is elevated, and that recently she's been stressed out because she is going to have surgery on Friday to her BPs have been 140s-150s/100s at her surgeon's office, and they want her to have her blood pressure controlled before her surgery so she is requesting a refill today. She also notes that she has gained 5 pounds in the recent months, and thinks this may contribute to her blood pressure. She denies any symptoms or complaints at this time, including leg swelling, headache, vision changes, fevers, chills, CP, SOB, abd pain, N/V/D/C, hematuria, dysuria, myalgias, arthralgias, numbness, tingling, or weakness.   Of note, chart review reveals Dr. Marin Comment prescribed "HCTZ 25mg  take 1/2 tab PO QD, may increase to 1 tab PO QD if BP >140/90". When pt is asked if this is the correct dosing, she acknowledges it as being correct but states that she had just been taking the pill if her DBP >90 instead of daily.    The history is provided by the patient and medical records. No language interpreter was used.  Hypertension  This is a chronic problem. The current episode started more than 1 week ago. The problem occurs every several days. The problem has not changed since onset.Pertinent negatives include no chest pain, no  abdominal pain, no headaches and no shortness of breath. Nothing aggravates the symptoms. Nothing relieves the symptoms. She has tried nothing for the symptoms. The treatment provided no relief.    Past Medical History:  Diagnosis Date  . Fibroid uterus 2014  . Unspecified vitamin D deficiency 09/26/2013   urgent care at [facility]    Patient Active Problem List   Diagnosis Date Noted  . Essential hypertension 07/13/2015  . Fibroid uterus 10/11/2013  . Irregular periods/menstrual cycles 10/03/2013    History reviewed. No pertinent surgical history.  OB History    No data available       Home Medications    Prior to Admission medications   Medication Sig Start Date End Date Taking? Authorizing Provider  furosemide (LASIX) 20 MG tablet TAKE 1 TABLET BY MOUTH DAILY 03/27/16   Shawnee Knapp, MD  hydrochlorothiazide (HYDRODIURIL) 25 MG tablet Take 1/2 tab po daily , may increase to 1 tab daily if BP is >140/90 08/06/15   Thao P Le, DO    Family History Family History  Problem Relation Age of Onset  . Diabetic kidney disease Mother   . Hypertension Mother   . Alcoholism Father   . Hypertension Father   . Cancer - Other Father 23    throat    Social History Social History  Substance Use Topics  . Smoking status: Never Smoker  . Smokeless tobacco: Not on file  .  Alcohol use No     Allergies   Patient has no known allergies.   Review of Systems Review of Systems  Constitutional: Negative for chills and fever.       +HTN, med refill  Eyes: Negative for visual disturbance.  Respiratory: Negative for shortness of breath.   Cardiovascular: Negative for chest pain and leg swelling.  Gastrointestinal: Negative for abdominal pain, constipation, diarrhea, nausea and vomiting.  Genitourinary: Negative for dysuria and hematuria.  Musculoskeletal: Negative for arthralgias and myalgias.  Skin: Negative for color change.  Allergic/Immunologic: Negative for immunocompromised  state.  Neurological: Negative for weakness, numbness and headaches.  Psychiatric/Behavioral: Negative for confusion.   10 Systems reviewed and are negative for acute change except as noted in the HPI.   Physical Exam Updated Vital Signs BP 136/99 (BP Location: Left Arm)   Pulse 85   Temp 98.1 F (36.7 C) (Oral)   Resp 18   Ht 5\' 4"  (1.626 m)   Wt 114.3 kg   SpO2 98%   BMI 43.26 kg/m   Physical Exam  Constitutional: She is oriented to person, place, and time. Vital signs are normal. She appears well-developed and well-nourished.  Non-toxic appearance. No distress.  Afebrile, nontoxic, NAD  HENT:  Head: Normocephalic and atraumatic.  Mouth/Throat: Oropharynx is clear and moist and mucous membranes are normal.  Eyes: Conjunctivae and EOM are normal. Right eye exhibits no discharge. Left eye exhibits no discharge.  Neck: Normal range of motion. Neck supple.  Cardiovascular: Normal rate, regular rhythm, normal heart sounds and intact distal pulses.  Exam reveals no gallop and no friction rub.   No murmur heard. RRR, nl s1/s2, no m/r/g, distal pulses intact, no pedal edema   Pulmonary/Chest: Effort normal and breath sounds normal. No respiratory distress. She has no decreased breath sounds. She has no wheezes. She has no rhonchi. She has no rales.  Abdominal: Soft. Normal appearance and bowel sounds are normal. She exhibits no distension. There is no tenderness. There is no rigidity, no rebound, no guarding, no CVA tenderness, no tenderness at McBurney's point and negative Murphy's sign.  Musculoskeletal: Normal range of motion.  MAE x4 Strength and sensation grossly intact in all extremities Distal pulses intact Gait steady No pedal edema, neg homan's bilaterally   Neurological: She is alert and oriented to person, place, and time. She has normal strength. No sensory deficit.  Skin: Skin is warm, dry and intact. No rash noted.  Psychiatric: She has a normal mood and affect.    Nursing note and vitals reviewed.    ED Treatments / Results  Labs (all labs ordered are listed, but only abnormal results are displayed) Labs Reviewed - No data to display  EKG  EKG Interpretation None       Radiology No results found.  Procedures Procedures (including critical care time)  Medications Ordered in ED Medications - No data to display   Initial Impression / Assessment and Plan / ED Course  I have reviewed the triage vital signs and the nursing notes.  Pertinent labs & imaging results that were available during my care of the patient were reviewed by me and considered in my medical decision making (see chart for details).  Clinical Course     45 y.o. female here for med refill of HCTZ. Out of it x3 wks. BP 136/99 here, but has been 140-150s/100s at surgeons office; scheduled for surgery on Fri and they want her BP under control. Completely asymptomatic. Doubt need for  emergent work up today, last labs in Jan 2017 show adequate kidney function, will refill HCTZ as one time courtesy but urged patient to f/up with PCP for ongoing management. Also encouraged DASH diet. I explained the diagnosis and have given explicit precautions to return to the ER including for any other new or worsening symptoms. The patient understands and accepts the medical plan as it's been dictated and I have answered their questions. Discharge instructions concerning home care and prescriptions have been given. The patient is STABLE and is discharged to home in good condition.   Final Clinical Impressions(s) / ED Diagnoses   Final diagnoses:  Medication refill  Essential hypertension    New Prescriptions New Prescriptions   HYDROCHLOROTHIAZIDE (HYDRODIURIL) 25 MG TABLET    Take 0.5 tablets (12.5 mg total) by mouth daily. Take 1/2 tab po daily , may increase to 1 tab daily if BP is >140/90     Teagan Heidrick Camprubi-Soms, PA-C 06/09/16 [place], MD 06/09/16 1528

## 2016-06-09 NOTE — ED Triage Notes (Signed)
Pt reports HTN.  States that she needs her HCTZ refilled-last dose 3 weeks ago.

## 2016-07-16 ENCOUNTER — Emergency Department (HOSPITAL_BASED_OUTPATIENT_CLINIC_OR_DEPARTMENT_OTHER)
Admission: EM | Admit: 2016-07-16 | Discharge: 2016-07-16 | Disposition: A | Discharge status: Home / Self Care | Payer: Self-pay | Attending: Emergency Medicine | Admitting: Emergency Medicine

## 2016-07-16 ENCOUNTER — Encounter (HOSPITAL_BASED_OUTPATIENT_CLINIC_OR_DEPARTMENT_OTHER): Payer: Self-pay | Admitting: *Deleted

## 2016-07-16 ENCOUNTER — Emergency Department (HOSPITAL_BASED_OUTPATIENT_CLINIC_OR_DEPARTMENT_OTHER): Payer: Self-pay

## 2016-07-16 DIAGNOSIS — J9801 Acute bronchospasm: Secondary | ICD-10-CM | POA: Insufficient documentation

## 2016-07-16 DIAGNOSIS — Z79899 Other long term (current) drug therapy: Secondary | ICD-10-CM | POA: Insufficient documentation

## 2016-07-16 DIAGNOSIS — J40 Bronchitis, not specified as acute or chronic: Secondary | ICD-10-CM | POA: Insufficient documentation

## 2016-07-16 DIAGNOSIS — I10 Essential (primary) hypertension: Secondary | ICD-10-CM | POA: Insufficient documentation

## 2016-07-16 MED ORDER — IPRATROPIUM BROMIDE 0.02 % IN SOLN
0.5000 mg | Freq: Once | RESPIRATORY_TRACT | Status: AC
  Administered 2016-07-16: 0.5 mg via RESPIRATORY_TRACT
  Filled 2016-07-16: qty 2.5

## 2016-07-16 MED ORDER — ALBUTEROL SULFATE (2.5 MG/3ML) 0.083% IN NEBU
2.5000 mg | INHALATION_SOLUTION | Freq: Once | RESPIRATORY_TRACT | Status: AC
  Administered 2016-07-16: 2.5 mg via RESPIRATORY_TRACT
  Filled 2016-07-16: qty 3

## 2016-07-16 MED ORDER — ALBUTEROL SULFATE HFA 108 (90 BASE) MCG/ACT IN AERS
2.0000 | INHALATION_SPRAY | RESPIRATORY_TRACT | Status: DC | PRN
  Administered 2016-07-16: 2 via RESPIRATORY_TRACT
  Filled 2016-07-16: qty 6.7

## 2016-07-16 NOTE — ED Triage Notes (Signed)
C/o congestion since Sunday. C/o cough with yellow sputum. Feels like she is wheezing.

## 2016-07-16 NOTE — ED Provider Notes (Signed)
[facility] Provider Note   CSN: [id number] Arrival date & time: 07/16/16  [id number]     History   Chief Complaint Chief Complaint  Patient presents with  . Cough    HPI Erin Glenn is a 46 y.o. female.  HPI  Pt presenting with c/o cough.  Symptoms began 4 days ago with nasal congestion and mild sore throat.  Cough began yesterday, last night she was up all night coughing.  She feels that she is wheezing- no hx of prior wheezing or asthma.  She denies smoking.  No fever/chills.  She has taken mucinex with mild relief.  Cough is nonproductive.  No chest pain.  No vomiting.  She is drinking liquids well.  She did have a friend with similar symptoms.  There are no other associated systemic symptoms, there are no other alleviating or modifying factors.   Past Medical History:  Diagnosis Date  . Fibroid uterus 2014  . Unspecified vitamin D deficiency 09/26/2013   urgent care at [facility]    Patient Active Problem List   Diagnosis Date Noted  . Essential hypertension 07/13/2015  . Fibroid uterus 10/11/2013  . Irregular periods/menstrual cycles 10/03/2013    History reviewed. No pertinent surgical history.  OB History    No data available       Home Medications    Prior to Admission medications   Medication Sig Start Date End Date Taking? Authorizing Provider  hydrochlorothiazide (HYDRODIURIL) 25 MG tablet Take 1/2 tab po daily , may increase to 1 tab daily if BP is >140/90 08/06/15  Yes Thao P Le, DO  hydrochlorothiazide (HYDRODIURIL) 25 MG tablet Take 0.5 tablets (12.5 mg total) by mouth daily. Take 1/2 tab po daily , may increase to 1 tab daily if BP is >140/90 06/09/16  Yes [employer], PA-C    Family History Family History  Problem Relation Age of Onset  . Diabetic kidney disease Mother   . Hypertension Mother   . Alcoholism Father   . Hypertension Father   . Cancer - Other Father 9    throat    Social History Social History  Substance  Use Topics  . Smoking status: Never Smoker  . Smokeless tobacco: Never Used  . Alcohol use No     Allergies   Hydrocodone-acetaminophen   Review of Systems Review of Systems  ROS reviewed and all otherwise negative except for mentioned in HPI   Physical Exam Updated Vital Signs BP 131/99 (BP Location: Right Arm)   Pulse 87   Temp 97.9 F (36.6 C) (Oral)   Resp 20   Ht 5\' 4"  (1.626 m)   Wt 210 lb (95.3 kg)   LMP 05/16/2016   SpO2 98%   BMI 36.05 kg/m  Vitals reviewed Physical Exam Physical Examination: General appearance - alert, well appearing, and in no distress Mental status - alert, oriented to person, place, and time Eyes - no conjunctival injection, no scleral icterus Mouth - mucous membranes moist, pharynx normal without lesions Neck - supple, no significant adenopathy Chest - BSS, bilateral wheezing, normal respiratory effort Heart - normal rate, regular rhythm, normal S1, S2, no murmurs, rubs, clicks or gallops Abdomen - soft, nontender, nondistended, no masses or organomegaly Neurological - alert, oriented, normal speech Extremities - peripheral pulses normal, no pedal edema, no clubbing or cyanosis Skin - normal coloration and turgor, no rashes  ED Treatments / Results  Labs (all labs ordered are listed, but only abnormal results are displayed) Labs Reviewed -  No data to display  EKG  EKG Interpretation None       Radiology No results found.  Procedures Procedures (including critical care time)  Medications Ordered in ED Medications  albuterol (PROVENTIL) (2.5 MG/3ML) 0.083% nebulizer solution 2.5 mg (2.5 mg Nebulization Given 07/16/16 1108)  ipratropium (ATROVENT) nebulizer solution 0.5 mg (0.5 mg Nebulization Given 07/16/16 1108)     Initial Impression / Assessment and Plan / ED Course  I have reviewed the triage vital signs and the nursing notes.  Pertinent labs & imaging results that were available during my care of the patient were  reviewed by me and considered in my medical decision making (see chart for details).     Pt presenting with c/o cough and wheezing.  Pt with wheezing improved after neb in the ED.  Pt discharged home with albuterol inhaler.  CXR is clear.  This is likely due a viral illness.  Discharged with strict return precautions.  Pt agreeable with plan.  Final Clinical Impressions(s) / ED Diagnoses   Final diagnoses:  Bronchitis  Bronchospasm    New Prescriptions Discharge Medication List as of 07/16/2016 11:46 AM       Alfonzo Beers, MD 07/21/16 2108

## 2016-07-16 NOTE — Discharge Instructions (Signed)
Return to the ED with any concerns including difficulty breathing despite using albuterol every 4 hours, not drinking fluids, decreased urine output, vomiting and not able to keep down liquids or medications, decreased level of alertness/lethargy, or any other alarming symptoms °

## 2016-08-10 ENCOUNTER — Ambulatory Visit: Payer: Self-pay | Attending: Family Medicine | Admitting: Family Medicine

## 2016-08-10 ENCOUNTER — Encounter: Payer: Self-pay | Admitting: Family Medicine

## 2016-08-10 VITALS — BP 116/79 | HR 83 | Temp 97.4°F | Resp 18 | Ht 64.0 in | Wt 245.6 lb

## 2016-08-10 DIAGNOSIS — D259 Leiomyoma of uterus, unspecified: Secondary | ICD-10-CM | POA: Insufficient documentation

## 2016-08-10 DIAGNOSIS — Z79899 Other long term (current) drug therapy: Secondary | ICD-10-CM | POA: Insufficient documentation

## 2016-08-10 DIAGNOSIS — Z124 Encounter for screening for malignant neoplasm of cervix: Secondary | ICD-10-CM

## 2016-08-10 DIAGNOSIS — Z86018 Personal history of other benign neoplasm: Secondary | ICD-10-CM

## 2016-08-10 DIAGNOSIS — Z0000 Encounter for general adult medical examination without abnormal findings: Secondary | ICD-10-CM | POA: Insufficient documentation

## 2016-08-10 DIAGNOSIS — N6029 Fibroadenosis of unspecified breast: Secondary | ICD-10-CM | POA: Insufficient documentation

## 2016-08-10 NOTE — Progress Notes (Signed)
Patient is here for complete physical  Patient denies pain today  Patient has eaten a banana today  Patient takes hydrochlorothiazide as needed  Patient did not took any meds today

## 2016-08-10 NOTE — Progress Notes (Signed)
   Subjective:  Patient ID: Erin Glenn, female    DOB: 1970/07/01  Age: 46 y.o. MRN: [id number]  CC: Annual Exam   HPI Erin Glenn presents for pap smear. Reports history of uterine fibroid in the pasts. She reports she has been told in the past to follow up every year for uterine fibroids. Explained to patient purpose of pap screening. She requests pap smear. She denies any vaginal discharge or dysuria. She does report pelvic pain but reports it is due to fibroids. She declines STD testing at this time.   Outpatient Medications Prior to Visit  Medication Sig Dispense Refill  . hydrochlorothiazide (HYDRODIURIL) 25 MG tablet Take 1/2 tab po daily , may increase to 1 tab daily if BP is >140/90 30 tablet 1  . hydrochlorothiazide (HYDRODIURIL) 25 MG tablet Take 0.5 tablets (12.5 mg total) by mouth daily. Take 1/2 tab po daily , may increase to 1 tab daily if BP is >140/90 15 tablet 0   No facility-administered medications prior to visit.     ROS Review of Systems  Respiratory: Negative.   Cardiovascular: Negative.   Gastrointestinal: Negative.   Skin: Negative.      Objective:  BP 116/79 (BP Location: Left Arm, Patient Position: Sitting, Cuff Size: Large)   Pulse 83   Temp 97.4 F (36.3 C) (Oral)   Resp 18   Ht 5\' 4"  (1.626 m)   Wt 245 lb 9.6 oz (111.4 kg)   SpO2 97%   BMI 42.16 kg/m   BP/Weight 08/10/2016 07/16/2016 [id number]  Systolic BP [id number]  Diastolic BP 79 99 81  Wt. (Lbs) 245.6 210 252  BMI 42.16 36.05 43.26     Physical Exam  Cardiovascular: Normal rate, regular rhythm, normal heart sounds and intact distal pulses.   Pulmonary/Chest: Effort normal and breath sounds normal. She exhibits mass (multiple dense breast lump  tissue. ).  Abdominal: Soft. Bowel sounds are normal.  Genitourinary: Vagina normal. Cervix exhibits no discharge. No vaginal discharge found.  Nursing note and vitals reviewed.    Assessment & Plan:   Problem List Items  Addressed This Visit    None    Visit Diagnoses    Pap smear for cervical cancer screening    -  Primary   Relevant Orders   Cytology - PAP [place] (Completed)   History of uterine fibroid       Relevant Orders   Ambulatory referral to Gynecology   Segmental fibroadenosis of breast, unspecified laterality       Relevant Orders   MM Digital Diagnostic Bilat        Follow-up: Return in about 2 weeks (around 08/24/2016) for Physical .   Alfonse Spruce FNP

## 2016-08-10 NOTE — Patient Instructions (Addendum)
Schedule appointment for annual physical in 2 weeks. Fill out paperwork for orange card for complete referral process.   Pap Test Why am I having this test? A pap test is sometimes called a pap smear. It is a screening test that is used to check for signs of cancer of the vagina, cervix, and uterus. The test can also identify the presence of infection or precancerous changes. Your health care provider will likely recommend you have this test done on a regular basis. This test may be done:  Every 3 years, starting at age 29.  Every 5 years, in combination with testing for the presence of human papillomavirus (HPV).  More or less often depending on other medical conditions. What kind of sample is taken? Using a small cotton swab, plastic spatula, or brush, your health care provider will collect a sample of cells from the surface of your cervix. Your cervix is the opening to your uterus, also called a womb. Secretions from the cervix and vagina may also be collected. How do I prepare for this test?  Be aware of where you are in your menstrual cycle. You may be asked to reschedule the test if you are menstruating on the day of the test.  You may need to reschedule if you have a known vaginal infection on the day of the test.  You may be asked to avoid douching or taking a bath the day before or the day of the test.  Some medicines can cause abnormal test results, such as digitalis and tetracycline. Talk with your health care provider before your test if you take one of these medicines. What do the results mean? Abnormal test results may indicate a number of health conditions. These may include:  Cancer. Although pap test results cannot be used to diagnose cancer of the cervix, vagina, or uterus, they may suggest the possibility of cancer. Further tests would be required to determine if cancer is present.  Sexually transmitted disease.  Fungal infection.  Parasite infection.  Herpes  infection.  A condition causing or contributing to infertility. It is your responsibility to obtain your test results. Ask the lab or department performing the test when and how you will get your results. Contact your health care provider to discuss any questions you have about your results. Talk with your health care provider to discuss your results, treatment options, and if necessary, the need for more tests. Talk with your health care provider if you have any questions about your results. This information is not intended to replace advice given to you by your health care provider. Make sure you discuss any questions you have with your health care provider. Document Released: 08/29/2002 Document Revised: 02/12/2016 Document Reviewed: 10/30/2013 Elsevier Interactive Patient Education  2017 [employer].

## 2016-08-10 NOTE — Progress Notes (Deleted)
Subjective:   Patient ID: Erin Glenn, female    DOB: 1970/07/03, 46 y.o.   MRN: [id number]  Chief Complaint  Patient presents with  . Annual Exam   HPI Erin Glenn 46 y.o. female presents with ***  Last March 2016  Women's Physician  MM   Breast cancer history   No vaginal discharge  Pelvic pain from  Declines STD testing    Past Medical History:  Diagnosis Date  . Fibroid uterus 2014  . Unspecified vitamin D deficiency 09/26/2013   urgent care at [facility]    No past surgical history on file.  Family History  Problem Relation Age of Onset  . Diabetic kidney disease Mother   . Hypertension Mother   . Alcoholism Father   . Hypertension Father   . Cancer - Other Father 66    throat    Social History   Social History  . Marital status: Married    Spouse name: N/A  . Number of children: N/A  . Years of education: N/A   Occupational History  . Not on file.   Social History Main Topics  . Smoking status: Never Smoker  . Smokeless tobacco: Never Used  . Alcohol use No  . Drug use: No  . Sexual activity: Yes    Birth control/ protection: None     Comment: G0 P0   Other Topics Concern  . Not on file   Social History Narrative  . No narrative on file    Outpatient Medications Prior to Visit  Medication Sig Dispense Refill  . hydrochlorothiazide (HYDRODIURIL) 25 MG tablet Take 1/2 tab po daily , may increase to 1 tab daily if BP is >140/90 30 tablet 1  . hydrochlorothiazide (HYDRODIURIL) 25 MG tablet Take 0.5 tablets (12.5 mg total) by mouth daily. Take 1/2 tab po daily , may increase to 1 tab daily if BP is >140/90 15 tablet 0   No facility-administered medications prior to visit.     Allergies  Allergen Reactions  . Hydrocodone-Acetaminophen Nausea Only    Review of Systems  Respiratory: Negative.   Cardiovascular: Negative.   Genitourinary: Negative.        Objective:    Physical Exam  BP 116/79 (BP Location: Left Arm,  Patient Position: Sitting, Cuff Size: Large)   Pulse 83   Temp 97.4 F (36.3 C) (Oral)   Resp 18   Ht 5\' 4"  (1.626 m)   Wt 245 lb 9.6 oz (111.4 kg)   SpO2 97%   BMI 42.16 kg/m  Wt Readings from Last 3 Encounters:  08/10/16 245 lb 9.6 oz (111.4 kg)  07/16/16 210 lb (95.3 kg)  06/09/16 252 lb (114.3 kg)    Immunization History  Administered Date(s) Administered  . PPD Test 11/12/2015    Diabetic Foot Exam - Simple   No data filed      Lab Results  Component Value Date   TSH 3.179 07/09/2015   Lab Results  Component Value Date   WBC 8.6 07/09/2015   HGB 12.2 07/09/2015   HCT 36.0 (A) 07/09/2015   MCV 74.8 (A) 07/09/2015   Lab Results  Component Value Date   NA 138 07/09/2015   K 4.0 07/09/2015   CO2 26 07/09/2015   GLUCOSE 93 07/09/2015   BUN 9 07/09/2015   CREATININE 0.65 07/09/2015   BILITOT 0.2 07/09/2015   ALKPHOS 70 07/09/2015   AST 18 07/09/2015   ALT 19 07/09/2015   PROT 7.3 07/09/2015  ALBUMIN 3.9 07/09/2015   CALCIUM 9.4 07/09/2015   No results found for: CHOL No results found for: HDL No results found for: LDLCALC No results found for: TRIG No results found for: CHOLHDL Lab Results  Component Value Date   HGBA1C 6.0 07/09/2015       Assessment & Plan:   Problem List Items Addressed This Visit    None      I am having Erin Glenn maintain her hydrochlorothiazide and hydrochlorothiazide.  No orders of the defined types were placed in this encounter.    Fredia Beets, FNP   `

## 2016-08-11 LAB — CYTOLOGY - PAP
Diagnosis: NEGATIVE
HPV: NOT DETECTED

## 2016-08-12 ENCOUNTER — Other Ambulatory Visit: Payer: Self-pay | Admitting: Family Medicine

## 2016-08-12 DIAGNOSIS — N6029 Fibroadenosis of unspecified breast: Secondary | ICD-10-CM

## 2016-08-19 ENCOUNTER — Telehealth: Payer: Self-pay

## 2016-08-19 ENCOUNTER — Ambulatory Visit: Payer: Self-pay

## 2016-08-19 NOTE — Telephone Encounter (Signed)
-----   Message from Alfonse Spruce, [place] sent at 08/18/2016  4:24 PM EST ----- Pap smear was negative for any malignancy.  HPV is negative.

## 2016-08-19 NOTE — Telephone Encounter (Signed)
CMA call to inform patient PAP results  Patient Verify DOB  Patient was aware and understood

## 2016-08-25 ENCOUNTER — Encounter: Payer: Self-pay | Admitting: Family Medicine

## 2016-08-27 ENCOUNTER — Encounter: Payer: Self-pay | Admitting: Obstetrics and Gynecology

## 2016-09-15 ENCOUNTER — Other Ambulatory Visit: Payer: Self-pay | Admitting: Family Medicine

## 2016-09-15 DIAGNOSIS — N6029 Fibroadenosis of unspecified breast: Secondary | ICD-10-CM

## 2016-09-29 ENCOUNTER — Encounter: Payer: Self-pay | Admitting: Obstetrics and Gynecology

## 2016-09-29 ENCOUNTER — Encounter: Payer: Self-pay | Admitting: Family Medicine

## 2016-09-29 NOTE — Progress Notes (Signed)
Patient did not keep GYN referral appointment for 09/29/2016.  Durene Romans MD Attending Center for [employer] [facility])

## 2016-09-30 ENCOUNTER — Other Ambulatory Visit: Payer: Self-pay | Admitting: Family Medicine

## 2016-09-30 ENCOUNTER — Telehealth: Payer: Self-pay | Admitting: Family Medicine

## 2016-09-30 NOTE — Telephone Encounter (Signed)
Patient called office to speak with nurse regarding results from breast exam performed on last visit. Pt is requesting information and clarification. Please follow up.  Thank you.

## 2016-09-30 NOTE — Telephone Encounter (Signed)
CMA call patient to gather more information of what she's requesting   Patient gave more information for CMA to help her out  Patient was aware and understood

## 2016-10-01 ENCOUNTER — Other Ambulatory Visit: Payer: Self-pay

## 2016-12-15 ENCOUNTER — Telehealth: Payer: Self-pay | Admitting: General Practice

## 2016-12-15 NOTE — Telephone Encounter (Signed)
PT called to request a change of PCP, Per Gaetano Net has been auth, please after next appt change PT PCP

## 2016-12-17 ENCOUNTER — Ambulatory Visit: Payer: Self-pay | Attending: Family Medicine | Admitting: Family Medicine

## 2016-12-17 VITALS — BP 138/97 | HR 74 | Temp 97.8°F | Resp 18 | Ht 64.0 in | Wt 252.0 lb

## 2016-12-17 DIAGNOSIS — K219 Gastro-esophageal reflux disease without esophagitis: Secondary | ICD-10-CM

## 2016-12-17 DIAGNOSIS — R1310 Dysphagia, unspecified: Secondary | ICD-10-CM

## 2016-12-17 DIAGNOSIS — Z131 Encounter for screening for diabetes mellitus: Secondary | ICD-10-CM

## 2016-12-17 DIAGNOSIS — I10 Essential (primary) hypertension: Secondary | ICD-10-CM

## 2016-12-17 DIAGNOSIS — R635 Abnormal weight gain: Secondary | ICD-10-CM

## 2016-12-17 LAB — POCT UA - MICROALBUMIN
Creatinine, POC: 200 mg/dL
Microalbumin Ur, POC: 10 mg/L

## 2016-12-17 LAB — POCT GLYCOSYLATED HEMOGLOBIN (HGB A1C): Hemoglobin A1C: 5.9

## 2016-12-17 MED ORDER — HYDROCHLOROTHIAZIDE 12.5 MG PO TABS: ORAL_TABLET | ORAL | 2 refills | Status: DC

## 2016-12-17 MED ORDER — OMEPRAZOLE 20 MG PO CPDR: 20.0000 mg | DELAYED_RELEASE_CAPSULE | Freq: Every day | ORAL | 2 refills | Status: DC

## 2016-12-17 NOTE — Progress Notes (Signed)
Patient is here for HTN  

## 2016-12-17 NOTE — Progress Notes (Signed)
Subjective:  Patient ID: Erin Glenn, female    DOB: 1970/09/09  Age: 46 y.o. MRN: [id number]  CC: Hypertension   HPI Erin Glenn presents for complaints of weight gain. PMH includes HTN.  She has recently started exercising and is adherent to low salt diet.  Blood pressure is not well controlled at home. Cardiac symptoms none. Patient denies chest pain, chest pressure/discomfort, claudication, dyspnea, lower extremity edema, palpitations and syncope.  Cardiovascular risk factors: hypertension and obesity (BMI >= 30 kg/m2). Use of agents associated with hypertension: none. History of target organ damage: none. Reports history of weight gain. Factors include increased caloric intake. She reports attempts to lose weight including recent excercise and diet changes.She does complains of heartburn. This has been associated with dysphagia and heartburn.  She denies hematemesis, melena, nausea and unexpected weight loss. Symptoms have been present for 4 months. Medical therapy in the past has included OTC antacids.     Outpatient Medications Prior to Visit  Medication Sig Dispense Refill  . hydrochlorothiazide (HYDRODIURIL) 25 MG tablet Take 1/2 tab po daily , may increase to 1 tab daily if BP is >140/90 30 tablet 1   No facility-administered medications prior to visit.     ROS Review of Systems  Constitutional:       Weight gain  HENT: Positive for trouble swallowing.   Eyes: Negative.   Respiratory: Negative.   Cardiovascular: Negative.   Gastrointestinal: Positive for nausea.       Heartburn  Neurological: Negative.     Objective:  BP (!) 138/97 (BP Location: Left Arm, Patient Position: Sitting, Cuff Size: Normal)   Pulse 74   Temp 97.8 F (36.6 C) (Oral)   Resp 18   Ht _0  (1.626 m)   Wt 252 lb (114.3 kg)   SpO2 99%   BMI 43.26 kg/m   BP/Weight 12/24/2016 12/17/2016 3/73/4287  Systolic BP - 681 157  Diastolic BP - 97 79  Wt. (Lbs) 250.44 252 245.6  BMI 42.99 43.26  42.16     Physical Exam  Constitutional: She is oriented to person, place, and time. She appears well-developed and well-nourished.  HENT:  Head: Normocephalic and atraumatic.  Right Ear: External ear normal.  Left Ear: External ear normal.  Nose: Nose normal.  Mouth/Throat: Oropharynx is clear and moist.  Eyes: Conjunctivae are normal. Pupils are equal, round, and reactive to light.  Neck: Normal range of motion. Neck supple. No JVD present.  Cardiovascular: Normal rate, regular rhythm, normal heart sounds and intact distal pulses.   Pulmonary/Chest: Effort normal and breath sounds normal.  Abdominal: Soft. Bowel sounds are normal.  Lymphadenopathy:    She has no cervical adenopathy.  Neurological: She is alert and oriented to person, place, and time.  Skin: Skin is warm and dry.  Nursing note and vitals reviewed.   Assessment & Plan:   Problem List Items Addressed This Visit      Cardiovascular and Mediastinum   Essential hypertension - Primary   HTCZ added.   Follow up in 2 weeks for BP check.   Relevant Medications   hydrochlorothiazide (HYDRODIURIL) 12.5 MG tablet   Other Relevant Orders   CMP14+EGFR (Completed)   POCT UA - Microalbumin (Completed)   Lipid Panel    Other Visit Diagnoses    Weight gain       Relevant Orders   TSH (Completed)   Amb ref to Medical Nutrition Therapy-MNT   Dysphagia, unspecified type  Relevant Orders   Ambulatory referral to Gastroenterology   Diabetes mellitus screening       Relevant Orders   HgB A1c (Completed)   Gastroesophageal reflux disease, esophagitis presence not specified       Relevant Medications   omeprazole (PRILOSEC) 20 MG capsule      Meds ordered this encounter  Medications  . omeprazole (PRILOSEC) 20 MG capsule    Sig: Take 1 capsule (20 mg total) by mouth daily.    Dispense:  30 capsule    Refill:  2    Order Specific Question:   Supervising Provider    Answer:   Tresa Garter [id number]    . hydrochlorothiazide (HYDRODIURIL) 12.5 MG tablet    Sig: Take 1/2 tab po daily , may increase to 1 tab daily if BP is >140/90    Dispense:  30 tablet    Refill:  2    Order Specific Question:   Supervising Provider    Answer:   Tresa Garter [[id number]]    Follow-up: Return in about 2 weeks (around 12/31/2016) for BP.   Alfonse Spruce FNP

## 2016-12-17 NOTE — Patient Instructions (Addendum)
Hypertension Hypertension is another name for high blood pressure. High blood pressure forces your heart to work harder to pump blood. This can cause problems over time. There are two numbers in a blood pressure reading. There is a top number (systolic) over a bottom number (diastolic). It is best to have a blood pressure below 120/80. Healthy choices can help lower your blood pressure. You may need medicine to help lower your blood pressure if:  Your blood pressure cannot be lowered with healthy choices.  Your blood pressure is higher than 130/80.  Follow these instructions at home: Eating and drinking  If directed, follow the DASH eating plan. This diet includes: ? Filling half of your plate at each meal with fruits and vegetables. ? Filling one quarter of your plate at each meal with whole grains. Whole grains include whole wheat pasta, brown rice, and whole grain bread. ? Eating or drinking low-fat dairy products, such as skim milk or low-fat yogurt. ? Filling one quarter of your plate at each meal with low-fat (lean) proteins. Low-fat proteins include fish, skinless chicken, eggs, beans, and tofu. ? Avoiding fatty meat, cured and processed meat, or chicken with skin. ? Avoiding premade or processed food.  Eat less than 1,500 mg of salt (sodium) a day.  Limit alcohol use to no more than 1 drink a day for nonpregnant women and 2 drinks a day for men. One drink equals 12 oz of beer, 5 oz of wine, or 1 oz of hard liquor. Lifestyle  Work with your doctor to stay at a healthy weight or to lose weight. Ask your doctor what the best weight is for you.  Get at least 30 minutes of exercise that causes your heart to beat faster (aerobic exercise) most days of the week. This may include walking, swimming, or biking.  Get at least 30 minutes of exercise that strengthens your muscles (resistance exercise) at least 3 days a week. This may include lifting weights or pilates.  Do not use any  products that contain nicotine or tobacco. This includes cigarettes and e-cigarettes. If you need help quitting, ask your doctor.  Check your blood pressure at home as told by your doctor.  Keep all follow-up visits as told by your doctor. This is important. Medicines  Take over-the-counter and prescription medicines only as told by your doctor. Follow directions carefully.  Do not skip doses of blood pressure medicine. The medicine does not work as well if you skip doses. Skipping doses also puts you at risk for problems.  Ask your doctor about side effects or reactions to medicines that you should watch for. Contact a doctor if:  You think you are having a reaction to the medicine you are taking.  You have headaches that keep coming back (recurring).  You feel dizzy.  You have swelling in your ankles.  You have trouble with your vision. Get help right away if:  You get a very bad headache.  You start to feel confused.  You feel weak or numb.  You feel faint.  You get very bad pain in your: ? Chest. ? Belly (abdomen).  You throw up (vomit) more than once.  You have trouble breathing. Summary  Hypertension is another name for high blood pressure.  Making healthy choices can help lower blood pressure. If your blood pressure cannot be controlled with healthy choices, you may need to take medicine. This information is not intended to replace advice given to you by your health care   provider. Make sure you discuss any questions you have with your health care provider. Document Released: 11/25/2007 Document Revised: 05/06/2016 Document Reviewed: 05/06/2016 Elsevier Interactive Patient Education  2018 [place] for Gastroesophageal Reflux Disease, Adult When you have gastroesophageal reflux disease (GERD), the foods you eat and your eating habits are very important. Choosing the right foods can help ease your discomfort. What guidelines do I need to  follow?  Choose fruits, vegetables, whole grains, and low-fat dairy products.  Choose low-fat meat, fish, and poultry.  Limit fats such as oils, salad dressings, butter, nuts, and avocado.  Keep a food diary. This helps you identify foods that cause symptoms.  Avoid foods that cause symptoms. These may be different for everyone.  Eat small meals often instead of 3 large meals a day.  Eat your meals slowly, in a place where you are relaxed.  Limit fried foods.  Cook foods using methods other than frying.  Avoid drinking alcohol.  Avoid drinking large amounts of liquids with your meals.  Avoid bending over or lying down until 2-3 hours after eating. What foods are not recommended? These are some foods and drinks that may make your symptoms worse: Vegetables Tomatoes. Tomato juice. Tomato and spaghetti sauce. Chili peppers. Onion and garlic. Horseradish. Fruits Oranges, grapefruit, and lemon (fruit and juice). Meats High-fat meats, fish, and poultry. This includes hot dogs, ribs, ham, sausage, salami, and bacon. Dairy Whole milk and chocolate milk. Sour cream. Cream. Butter. Ice cream. Cream cheese. Drinks Coffee and tea. Bubbly (carbonated) drinks or energy drinks. Condiments Hot sauce. Barbecue sauce. Sweets/Desserts Chocolate and cocoa. Donuts. Peppermint and spearmint. Fats and Oils High-fat foods. This includes [place] fries and potato chips. Other Vinegar. Strong spices. This includes black pepper, white pepper, red pepper, cayenne, curry powder, cloves, ginger, and chili powder. The items listed above may not be a complete list of foods and drinks to avoid. Contact your dietitian for more information. This information is not intended to replace advice given to you by your health care provider. Make sure you discuss any questions you have with your health care provider. Document Released: 12/08/2011 Document Revised: 11/14/2015 Document Reviewed: 04/12/2013 Elsevier  Interactive Patient Education  2017 [employer].

## 2016-12-18 LAB — CMP14+EGFR
A/G RATIO: 1 — AB (ref 1.2–2.2)
ALT: 18 IU/L (ref 0–32)
AST: 17 IU/L (ref 0–40)
Albumin: 3.9 g/dL (ref 3.5–5.5)
Alkaline Phosphatase: 99 IU/L (ref 39–117)
BUN/Creatinine Ratio: 11 (ref 9–23)
BUN: 7 mg/dL (ref 6–24)
Bilirubin Total: 0.2 mg/dL (ref 0.0–1.2)
CALCIUM: 9.3 mg/dL (ref 8.7–10.2)
CO2: 23 mmol/L (ref 20–29)
Chloride: 102 mmol/L (ref 96–106)
Creatinine, Ser: 0.63 mg/dL (ref 0.57–1.00)
GFR, EST AFRICAN AMERICAN: 127 mL/min/{1.73_m2} (ref >59)
GFR, EST NON AFRICAN AMERICAN: 110 mL/min/{1.73_m2} (ref >59)
GLUCOSE: 80 mg/dL (ref 65–99)
Globulin, Total: 3.8 g/dL (ref 1.5–4.5)
Potassium: 3.9 mmol/L (ref 3.5–5.2)
Sodium: 140 mmol/L (ref 134–144)
TOTAL PROTEIN: 7.7 g/dL (ref 6.0–8.5)

## 2016-12-18 LAB — TSH: TSH: 2.1 u[IU]/mL (ref 0.450–4.500)

## 2016-12-24 ENCOUNTER — Encounter: Payer: Self-pay | Admitting: Registered"

## 2016-12-24 ENCOUNTER — Telehealth: Payer: Self-pay

## 2016-12-24 ENCOUNTER — Encounter: Payer: Self-pay | Attending: Family Medicine | Admitting: Registered"

## 2016-12-24 DIAGNOSIS — R635 Abnormal weight gain: Secondary | ICD-10-CM | POA: Insufficient documentation

## 2016-12-24 DIAGNOSIS — Z713 Dietary counseling and surveillance: Secondary | ICD-10-CM | POA: Insufficient documentation

## 2016-12-24 NOTE — Progress Notes (Signed)
Medical Nutrition Therapy:  Appt start time: 1005 end time:  0867.   Assessment:  Primary concerns today: Pt referred for appointment due to weight gain. Pt says she would like to lose weight. Per pt she has started Weight Watchers but her doctor told her that the recommended Weight Watchers meals are too high in sodium. Pt says she has asked her gynecologist about prescribing her a diet pill. Pt says she lost 30 lb in the past while taking Garcinia and doing Weight Watchers but discontinued taking the diet pill because it was elevating her blood pressure. Pt says she loves going to the gym because of the support system and she enjoys the support from going to [employer] meetings as well, but they are too expensive. Pt says she works night shifts and working late negatively affects her eating schedule. 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 Pt says she often works on her computer while at work and will want to snack as she works.   Preferred Learning Style:   No preference indicated   Learning Readiness:  Ready   MEDICATIONS: See list.    DIETARY INTAKE:  Usual eating pattern includes 3 meals and 3 snacks per day.  Everyday foods include banana, watermelon.  Avoided foods include none reported.    Pt says meals are usually eaten in the kitchen with electronics present.    24-hr recall:  B ( AM): banana  Snk ( AM): watermelon  L ( PM): Spaghetti, corn on the cob Snk ( PM): watermelon D ( PM): 1 hamburger, 2 hotdogs, 1 small piece of steak, 1 small serving of potato salad Snk ( PM): watermelon, low calorie icee  Beverages: 7 glasses of water  Noted first 24 recall was from a holiday and was atypical per pt.   B ( AM): banana  Snk ( AM): None reported.  L ( PM): Healthy Choice meal, salad with ranch  Snk ( PM): watermelon D ( PM): chef salad (tomatoes, [place], ham, lettuce, cheese) with ranch dressing   Snk ( PM): watermelon Snk (PM): low calorie icee and banana  Beverages: 8 glasses of water   Usual physical activity: gym 1 hour/5 days a week  Progress Towards Goal(s):  In progress.   Nutritional Diagnosis:  NI-5.11.1 Predicted suboptimal nutrient intake As related to unbalanced meals low in whole grains .  As evidenced by pt's diet recall.    Intervention:  Nutrition counseling provided. Dietitian provided education on balanced nutrition. Dietitian discussed with pt that taking diet pills can have negative side effects and that they do not provide a long-term solution to weight loss/maintanance. Dietitian encouraged pt to focus on promoting health through consuming a balanced diet and promoting healthy eating habits rather than focusing on weight/losing weight. Dietitian discussed pt's lab values and provided education on the interaction between blood sugar and food intake. Dietitian provided education on mindful eating and encouraged pt to have meals at a table without distractions. Dietitian discussed strategies to avoid eating due to boredom while at work. Dietitian encouraged pt to include some protein at breakfast and with her snacks to promote satiety and help with blood sugar regulation. Dietitian encouraged pt to continue engaging in regular physical activity.   Goals:   Make sure to get in 3 meals per day. Try to eat balanced meals and snacks (see handouts).  For breakfast-try to include some protein  with your carbohydrate. Maybe try fruit in some [place] Yogurt.   Try to practice mindful eating. Try to eat meals at the table without distractions (no TV, phone etc). If you feel you are wanting to snack due to boredom try to do an activity for at least 20 minutes.  Calorieking.com -provides information about nutrition in foods at restaurants.  Teaching Method Utilized:  Visual Auditory  Handouts given during visit include:  Balanced plate  Hypertension Nutrition Therapy    Low Carb Snacks sheet   Carbohydrate content of foods sheet   Barriers to learning/adherence to lifestyle change: None indicated.   Demonstrated degree of understanding via:  Teach Back   Monitoring/Evaluation:  Dietary intake, exercise, and body weight in 1 month(s).

## 2016-12-24 NOTE — Patient Instructions (Addendum)
Make sure to get in 3 meals per day. Try to eat balanced meals and snacks(see handouts).  For breakfast-try to include some protein with your carbohydrate. Maybe try fruit in some [place] Yogurt.   Try to practice mindful eating. Try to eat meals at the table without distractions (no TV, phone etc). If you feel you are wanting to snack due to boredom try to do an activity for at least 20 minutes.  Calorieking.com -provides information about nutrition in foods at restaurants.

## 2016-12-24 NOTE — Telephone Encounter (Signed)
-----   Message from Alfonse Spruce, [place] sent at 12/22/2016  9:47 AM EDT ----- Thyroid function normal. When thyroid is underfunctioning it can cause weight gain. Kidney function normal Liver function normal

## 2016-12-24 NOTE — Telephone Encounter (Signed)
CMA call regarding lab results   Patient did not answer but left a VM stating the reason of the call & to call back  

## 2016-12-29 ENCOUNTER — Telehealth: Payer: Self-pay

## 2016-12-29 ENCOUNTER — Other Ambulatory Visit: Payer: Self-pay | Admitting: Family Medicine

## 2016-12-29 DIAGNOSIS — R632 Polyphagia: Secondary | ICD-10-CM

## 2016-12-29 NOTE — Telephone Encounter (Signed)
CMA call patient regarding advice only   Patient was aware and understood

## 2016-12-29 NOTE — Telephone Encounter (Signed)
Patient return call   Patient Verify DOB  Patient was aware and understood

## 2017-01-13 ENCOUNTER — Other Ambulatory Visit: Payer: Self-pay | Admitting: Family Medicine

## 2017-01-13 ENCOUNTER — Telehealth: Payer: Self-pay | Admitting: Family Medicine

## 2017-01-13 NOTE — Telephone Encounter (Signed)
Pt. Called upset stating that she did not want to be referred to the nutritionist. Pt. States that she received a call and from the nutritionist and was told that they did not prescribe medication. Pt. Stated that she had told her PCP that she wanted to be prescribed medication for her weight loss. Please advice?

## 2017-01-13 NOTE — Telephone Encounter (Signed)
Pt. Called upset stating that she did not want to be referred to the nutritionist. Pt. States that she received a call and from the nutritionist and was told that they did not prescribe medication. Pt. Stated that she had told her PCP that she wanted to be prescribed medication for her weight loss. Please f/u with pt.

## 2017-01-18 NOTE — Telephone Encounter (Signed)
Second referral was placed for medical nutrition therapy was unaware that clinic did not provide weight loss medication. Since patient has insurance she has the option of calling and scheduling an appointment with local weight loss clinics in the area. Some options include [facility], [facility], [facility], and [facility].

## 2017-01-19 NOTE — Telephone Encounter (Signed)
CMA call patient regarding advice  Patient was aware and understood

## 2017-01-27 ENCOUNTER — Ambulatory Visit: Payer: Self-pay | Admitting: Registered"

## 2017-03-02 ENCOUNTER — Other Ambulatory Visit: Payer: Self-pay | Admitting: Family Medicine

## 2017-03-02 DIAGNOSIS — I10 Essential (primary) hypertension: Secondary | ICD-10-CM

## 2017-03-03 NOTE — Telephone Encounter (Signed)
Spoke with patient & she stated that she went to her OBGYN & check her BP there, she stated that she can not offer paying for another visit just so they can check her BP check

## 2017-03-03 NOTE — Telephone Encounter (Signed)
CMA call regarding medication refill ready for pick up   Patient verify DOB   Patient was aware and understood

## 2017-06-09 ENCOUNTER — Other Ambulatory Visit: Payer: Self-pay | Admitting: Family Medicine

## 2017-06-09 ENCOUNTER — Telehealth: Payer: Self-pay | Admitting: Family Medicine

## 2017-06-09 ENCOUNTER — Ambulatory Visit: Payer: Self-pay | Admitting: Family Medicine

## 2017-06-09 DIAGNOSIS — I10 Essential (primary) hypertension: Secondary | ICD-10-CM

## 2017-06-09 MED ORDER — HYDROCHLOROTHIAZIDE 12.5 MG PO TABS: ORAL_TABLET | ORAL | 0 refills | Status: DC

## 2017-06-09 NOTE — Telephone Encounter (Signed)
Patient was not prescribed lasix from PCP. Lasix was d/c 2017 by another provider. Lasix will not be prescribed. 30 day refill sent to [facility]. Recommend she schedule a follow up appt.

## 2017-06-09 NOTE — Telephone Encounter (Signed)
Pt came to the appt and she was late , by you can you auth the refill for hydrochlorothiazide (HYDRODIURIL) 12.5 MG tablet furosemide (LASIX) 20 MG tablet   can you please sent it to [facility] on [address] in [place] Please follow up

## 2017-06-22 HISTORY — PX: DILATION AND CURETTAGE OF UTERUS: SHX78

## 2017-06-29 ENCOUNTER — Telehealth: Payer: Self-pay | Admitting: Family Medicine

## 2017-06-29 DIAGNOSIS — I10 Essential (primary) hypertension: Secondary | ICD-10-CM

## 2017-06-29 MED ORDER — HYDROCHLOROTHIAZIDE 12.5 MG PO TABS: ORAL_TABLET | ORAL | 0 refills | Status: DC

## 2017-06-29 NOTE — Telephone Encounter (Signed)
Pt called to request a refill on  -hydrochlorothiazide (HYDRODIURIL) 12.5 MG tablet  If approved please send to [place] [address] Please follow up

## 2017-06-29 NOTE — Addendum Note (Signed)
Addended by: Rica Mast on: 06/29/2017 09:17 AM   Modules accepted: Orders

## 2017-06-29 NOTE — Telephone Encounter (Signed)
Refilled x 1 week to last until appt with PCP

## 2017-07-01 ENCOUNTER — Ambulatory Visit: Payer: Self-pay | Attending: Family Medicine | Admitting: Family Medicine

## 2017-07-20 DIAGNOSIS — Z1329 Encounter for screening for other suspected endocrine disorder: Secondary | ICD-10-CM | POA: Diagnosis not present

## 2017-07-20 DIAGNOSIS — R635 Abnormal weight gain: Secondary | ICD-10-CM | POA: Diagnosis not present

## 2017-07-20 DIAGNOSIS — N939 Abnormal uterine and vaginal bleeding, unspecified: Secondary | ICD-10-CM | POA: Diagnosis not present

## 2017-07-20 DIAGNOSIS — N951 Menopausal and female climacteric states: Secondary | ICD-10-CM | POA: Diagnosis not present

## 2017-07-27 DIAGNOSIS — N950 Postmenopausal bleeding: Secondary | ICD-10-CM | POA: Diagnosis not present

## 2017-07-27 DIAGNOSIS — N841 Polyp of cervix uteri: Secondary | ICD-10-CM | POA: Diagnosis not present

## 2017-09-06 DIAGNOSIS — N6310 Unspecified lump in the right breast, unspecified quadrant: Secondary | ICD-10-CM | POA: Diagnosis not present

## 2017-09-15 ENCOUNTER — Other Ambulatory Visit: Payer: Self-pay | Admitting: *Deleted

## 2017-09-15 DIAGNOSIS — I10 Essential (primary) hypertension: Secondary | ICD-10-CM

## 2017-09-15 MED ORDER — HYDROCHLOROTHIAZIDE 12.5 MG PO TABS: ORAL_TABLET | ORAL | 0 refills | Status: DC

## 2017-09-16 DIAGNOSIS — N950 Postmenopausal bleeding: Secondary | ICD-10-CM | POA: Diagnosis not present

## 2017-09-16 DIAGNOSIS — N840 Polyp of corpus uteri: Secondary | ICD-10-CM | POA: Diagnosis not present

## 2017-09-21 ENCOUNTER — Other Ambulatory Visit: Payer: Self-pay

## 2017-09-21 ENCOUNTER — Ambulatory Visit (INDEPENDENT_AMBULATORY_CARE_PROVIDER_SITE_OTHER): Payer: Self-pay | Admitting: Physician Assistant

## 2017-09-21 ENCOUNTER — Encounter (INDEPENDENT_AMBULATORY_CARE_PROVIDER_SITE_OTHER): Payer: Self-pay | Admitting: Physician Assistant

## 2017-09-21 VITALS — BP 125/86 | HR 71 | Temp 97.5°F | Ht 64.0 in | Wt 217.0 lb

## 2017-09-21 DIAGNOSIS — Z9889 Other specified postprocedural states: Secondary | ICD-10-CM

## 2017-09-21 DIAGNOSIS — I10 Essential (primary) hypertension: Secondary | ICD-10-CM

## 2017-09-21 MED ORDER — NAPROXEN 500 MG PO TABS
500.0000 mg | ORAL_TABLET | Freq: Two times a day (BID) | ORAL | 0 refills | Status: DC
Start: 2017-09-21 — End: 2017-11-18

## 2017-09-21 MED ORDER — FUROSEMIDE 20 MG PO TABS: 20.0000 mg | ORAL_TABLET | Freq: Every day | ORAL | 0 refills | Status: DC

## 2017-09-21 NOTE — Progress Notes (Signed)
Subjective:  Patient ID: Erin Glenn, female    DOB: Nov 01, 1970  Age: 47 y.o. MRN: [id number]  CC: HTN and edema  HPI Erin Glenn is a 47 y.o. female with a medical history of HTN, fibroids s/p ablation?, and vitamin D deficiency presents as a new patient for management of HTN. Last BP 138/97 mmHg at outside provider on 12/17/16. BP 125/86 mmHg today. Said she had some LLE > RLE swelling after her ablation 5 days ago but used some HCTZ 12.5 mg with reduction of swelling. Reports she weighed 208 lbs 5 days ago and 217 today. Believes she is holding on to water weight. Feels some bloating in the lower abdomen. No hematochezia, melena, or urinary dysfunction. Deneis CP, palpitations, SOB, HA, tingling, numbness, f/c/n/v, or rash.     Outpatient Medications Prior to Visit  Medication Sig Dispense Refill  . hydrochlorothiazide (HYDRODIURIL) 12.5 MG tablet TAKE 1/2 TABLET BY MOUTH DAILY, MAY INCREASE TO 1 TABLET DAILY IF BLOOD PRESSURE IS> 140/ 90 15 tablet 0  . omeprazole (PRILOSEC) 20 MG capsule Take 1 capsule (20 mg total) by mouth daily. 30 capsule 2   No facility-administered medications prior to visit.      ROS Review of Systems  Constitutional: Negative for chills, fever and malaise/fatigue.  Eyes: Negative for blurred vision.  Respiratory: Negative for shortness of breath.   Cardiovascular: Negative for chest pain and palpitations.  Gastrointestinal: Negative for abdominal pain, blood in stool, constipation, diarrhea, melena, nausea and vomiting.       Abdominal bloating  Genitourinary: Negative for dysuria and hematuria.  Musculoskeletal: Negative for joint pain and myalgias.  Skin: Negative for rash.  Neurological: Negative for tingling and headaches.  Psychiatric/Behavioral: Negative for depression. The patient is not nervous/anxious.     Objective:  BP 125/86 (BP Location: Right Arm, Patient Position: Sitting, Cuff Size: Large)   Pulse 71   Temp (!) 97.5 F (36.4  C) (Oral)   Ht 5\' 4"  (1.626 m)   Wt 217 lb (98.4 kg)   SpO2 98%   BMI 37.25 kg/m   BP/Weight 09/21/2017 12/24/2016 7/82/9562  Systolic BP 130 - 865  Diastolic BP 86 - 97  Wt. (Lbs) 217 250.44 252  BMI 37.25 42.99 43.26      Physical Exam  Constitutional: She is oriented to person, place, and time.  Well developed, overweight, NAD, polite  HENT:  Head: Normocephalic and atraumatic.  Eyes: No scleral icterus.  Neck: Normal range of motion. Neck supple. No thyromegaly present.  Cardiovascular: Normal rate, regular rhythm and normal heart sounds.  No LE edema bilaterally  Pulmonary/Chest: Effort normal and breath sounds normal. No respiratory distress. She has no wheezes. She has no rales.  Abdominal: Soft. Bowel sounds are normal. She exhibits no distension and no mass. There is tenderness (TTP in the hypogastric region). There is no rebound and no guarding.  Musculoskeletal: She exhibits no edema.  Neurological: She is alert and oriented to person, place, and time. No cranial nerve deficit. Coordination normal.  Skin: Skin is warm and dry. No rash noted. No erythema. No pallor.  Psychiatric: She has a normal mood and affect. Her behavior is normal. Thought content normal.  Vitals reviewed.    Assessment & Plan:    1. Hypertension, unspecified type - CBC with Differential - Comprehensive metabolic panel - Use for 5 consecutive days only. Furosemide (LASIX) 20 MG tablet; Take 1 tablet (20 mg total) by mouth daily.  Dispense: 14 tablet; Refill: 0  2. S/P endometrial ablation - Begin naproxen (NAPROSYN) 500 MG tablet; Take 1 tablet (500 mg total) by mouth 2 (two) times daily with a meal.  Dispense: 30 tablet; Refill: 0 - Advised to keep f/u appt with GYN as directed  Meds ordered this encounter  Medications  . naproxen (NAPROSYN) 500 MG tablet    Sig: Take 1 tablet (500 mg total) by mouth 2 (two) times daily with a meal.    Dispense:  30 tablet    Refill:  0    Order  Specific Question:   Supervising Provider    Answer:   Tresa Garter [id number]  . furosemide (LASIX) 20 MG tablet    Sig: Take 1 tablet (20 mg total) by mouth daily.    Dispense:  14 tablet    Refill:  0    Order Specific Question:   Supervising Provider    Answer:   Tresa Garter [id number]    Follow-up: Return in about 1 month (around 10/19/2017) for discuss chronic back pain.Clent Demark PA

## 2017-09-21 NOTE — Patient Instructions (Signed)
Furosemide tablets What is this medicine? FUROSEMIDE (fyoor [place] se mide) is a diuretic. It helps you make more urine and to lose salt and excess water from your body. This medicine is used to treat high blood pressure, and edema or swelling from heart, kidney, or liver disease. This medicine may be used for other purposes; ask your health care provider or pharmacist if you have questions. COMMON BRAND NAME(S): Active-Medicated Specimen Kit, Delone, Diuscreen, Lasix, RX Specimen Collection Kit, Specimen Collection Kit, URINX Medicated Specimen Collection What should I tell my health care provider before I take this medicine? They need to know if you have any of these conditions: -abnormal blood electrolytes -diarrhea or vomiting -gout -heart disease -kidney disease, small amounts of urine, or difficulty passing urine -liver disease -thyroid disease -an unusual or allergic reaction to furosemide, sulfa drugs, other medicines, foods, dyes, or preservatives -pregnant or trying to get pregnant -breast-feeding How should I use this medicine? Take this medicine by mouth with a glass of water. Follow the directions on the prescription label. You may take this medicine with or without food. If it upsets your stomach, take it with food or milk. Do not take your medicine more often than directed. Remember that you will need to pass more urine after taking this medicine. Do not take your medicine at a time of day that will cause you problems. Do not take at bedtime. Talk to your pediatrician regarding the use of this medicine in children. While this drug may be prescribed for selected conditions, precautions do apply. Overdosage: If you think you have taken too much of this medicine contact a poison control center or emergency room at once. NOTE: This medicine is only for you. Do not share this medicine with others. What if I miss a dose? If you miss a dose, take it as soon as you can. If it is almost time  for your next dose, take only that dose. Do not take double or extra doses. What may interact with this medicine? -aspirin and aspirin-like medicines -certain antibiotics -chloral hydrate -cisplatin -cyclosporine -digoxin -diuretics -laxatives -lithium -medicines for blood pressure -medicines that relax muscles for surgery -methotrexate -NSAIDs, medicines for pain and inflammation like ibuprofen, naproxen, or indomethacin -phenytoin -steroid medicines like prednisone or cortisone -sucralfate -thyroid hormones This list may not describe all possible interactions. Give your health care provider a list of all the medicines, herbs, non-prescription drugs, or dietary supplements you use. Also tell them if you smoke, drink alcohol, or use illegal drugs. Some items may interact with your medicine. What should I watch for while using this medicine? Visit your doctor or health care professional for regular checks on your progress. Check your blood pressure regularly. Ask your doctor or health care professional what your blood pressure should be, and when you should contact him or her. If you are a diabetic, check your blood sugar as directed. You may need to be on a special diet while taking this medicine. Check with your doctor. Also, ask how many glasses of fluid you need to drink a day. You must not get dehydrated. You may get drowsy or dizzy. Do not drive, use machinery, or do anything that needs mental alertness until you know how this drug affects you. Do not stand or sit up quickly, especially if you are an older patient. This reduces the risk of dizzy or fainting spells. Alcohol can make you more drowsy and dizzy. Avoid alcoholic drinks. This medicine can make you more sensitive  to the sun. Keep out of the sun. If you cannot avoid being in the sun, wear protective clothing and use sunscreen. Do not use sun lamps or tanning beds/booths. What side effects may I notice from receiving this  medicine? Side effects that you should report to your doctor or health care professional as soon as possible: -blood in urine or stools -dry mouth -fever or chills -hearing loss or ringing in the ears -irregular heartbeat -muscle pain or weakness, cramps -skin rash -stomach upset, pain, or nausea -tingling or numbness in the hands or feet -unusually weak or tired -vomiting or diarrhea -yellowing of the eyes or skin Side effects that usually do not require medical attention (report to your doctor or health care professional if they continue or are bothersome): -headache -loss of appetite -unusual bleeding or bruising This list may not describe all possible side effects. Call your doctor for medical advice about side effects. You may report side effects to FDA at 1-800-FDA-1088. Where should I keep my medicine? Keep out of the reach of children. Store at room temperature between 15 and 30 degrees C (59 and 86 degrees F). Protect from light. Throw away any unused medicine after the expiration date. NOTE: This sheet is a summary. It may not cover all possible information. If you have questions about this medicine, talk to your doctor, pharmacist, or health care provider.  2018 Elsevier/Gold Standard (2014-08-29 13:49:50)  

## 2017-09-22 ENCOUNTER — Telehealth (INDEPENDENT_AMBULATORY_CARE_PROVIDER_SITE_OTHER): Payer: Self-pay

## 2017-09-22 LAB — COMPREHENSIVE METABOLIC PANEL
ALK PHOS: 86 IU/L (ref 39–117)
ALT: 16 IU/L (ref 0–32)
AST: 17 IU/L (ref 0–40)
Albumin/Globulin Ratio: 1.1 — ABNORMAL LOW (ref 1.2–2.2)
Albumin: 4 g/dL (ref 3.5–5.5)
BILIRUBIN TOTAL: 0.2 mg/dL (ref 0.0–1.2)
BUN/Creatinine Ratio: 21 (ref 9–23)
BUN: 12 mg/dL (ref 6–24)
CO2: 22 mmol/L (ref 20–29)
CREATININE: 0.58 mg/dL (ref 0.57–1.00)
Calcium: 9.3 mg/dL (ref 8.7–10.2)
Chloride: 104 mmol/L (ref 96–106)
GFR calc Af Amer: 130 mL/min/{1.73_m2} (ref >59)
GFR calc non Af Amer: 112 mL/min/{1.73_m2} (ref >59)
Globulin, Total: 3.5 g/dL (ref 1.5–4.5)
Glucose: 91 mg/dL (ref 65–99)
Potassium: 3.9 mmol/L (ref 3.5–5.2)
SODIUM: 142 mmol/L (ref 134–144)
Total Protein: 7.5 g/dL (ref 6.0–8.5)

## 2017-09-22 LAB — CBC WITH DIFFERENTIAL/PLATELET
Basophils Absolute: 0 10*3/uL (ref 0.0–0.2)
Basos: 0 %
EOS (ABSOLUTE): 0.2 10*3/uL (ref 0.0–0.4)
Eos: 3 %
Hematocrit: 36.9 % (ref 34.0–46.6)
Hemoglobin: 12.5 g/dL (ref 11.1–15.9)
Immature Grans (Abs): 0 10*3/uL (ref 0.0–0.1)
Immature Granulocytes: 0 %
LYMPHS ABS: 1.8 10*3/uL (ref 0.7–3.1)
Lymphs: 25 %
MCH: 26.3 pg — AB (ref 26.6–33.0)
MCHC: 33.9 g/dL (ref 31.5–35.7)
MCV: 78 fL — ABNORMAL LOW (ref 79–97)
Monocytes Absolute: 0.4 10*3/uL (ref 0.1–0.9)
Monocytes: 5 %
NEUTROS ABS: 4.7 10*3/uL (ref 1.4–7.0)
Neutrophils: 67 %
Platelets: 331 10*3/uL (ref 150–379)
RBC: 4.75 x10E6/uL (ref 3.77–5.28)
RDW: 15.2 % (ref 12.3–15.4)
WBC: 7 10*3/uL (ref 3.4–10.8)

## 2017-09-22 NOTE — Telephone Encounter (Signed)
Patient aware of normal labs. Timoty Bourke S Yitzhak Awan, CMA  

## 2017-09-22 NOTE — Telephone Encounter (Signed)
-----   Message from Clent Demark, PA-C sent at 09/22/2017  9:27 AM EDT ----- Labs normal.

## 2017-10-05 ENCOUNTER — Other Ambulatory Visit: Payer: Self-pay

## 2017-10-05 DIAGNOSIS — I10 Essential (primary) hypertension: Secondary | ICD-10-CM

## 2017-10-05 MED ORDER — HYDROCHLOROTHIAZIDE 12.5 MG PO TABS: ORAL_TABLET | ORAL | 3 refills | Status: DC

## 2017-10-21 ENCOUNTER — Ambulatory Visit (INDEPENDENT_AMBULATORY_CARE_PROVIDER_SITE_OTHER): Payer: Self-pay | Admitting: Physician Assistant

## 2017-11-09 ENCOUNTER — Ambulatory Visit: Payer: Self-pay | Admitting: Internal Medicine

## 2017-11-09 DIAGNOSIS — Z01419 Encounter for gynecological examination (general) (routine) without abnormal findings: Secondary | ICD-10-CM | POA: Diagnosis not present

## 2017-11-09 DIAGNOSIS — Z6836 Body mass index (BMI) 36.0-36.9, adult: Secondary | ICD-10-CM | POA: Diagnosis not present

## 2017-11-10 ENCOUNTER — Other Ambulatory Visit: Payer: Self-pay | Admitting: Obstetrics and Gynecology

## 2017-11-10 DIAGNOSIS — N63 Unspecified lump in unspecified breast: Secondary | ICD-10-CM

## 2017-11-12 ENCOUNTER — Other Ambulatory Visit: Payer: Self-pay

## 2017-11-16 ENCOUNTER — Other Ambulatory Visit: Payer: Self-pay | Admitting: Obstetrics and Gynecology

## 2017-11-16 ENCOUNTER — Ambulatory Visit
Admission: RE | Admit: 2017-11-16 | Discharge: 2017-11-16 | Disposition: A | Discharge status: Home / Self Care | Payer: BLUE CROSS/BLUE SHIELD | Source: Ambulatory Visit | Attending: Obstetrics and Gynecology | Admitting: Obstetrics and Gynecology

## 2017-11-16 DIAGNOSIS — R928 Other abnormal and inconclusive findings on diagnostic imaging of breast: Secondary | ICD-10-CM | POA: Diagnosis not present

## 2017-11-16 DIAGNOSIS — N63 Unspecified lump in unspecified breast: Secondary | ICD-10-CM

## 2017-11-16 DIAGNOSIS — N6310 Unspecified lump in the right breast, unspecified quadrant: Secondary | ICD-10-CM | POA: Diagnosis not present

## 2017-11-16 DIAGNOSIS — N641 Fat necrosis of breast: Secondary | ICD-10-CM

## 2017-11-18 ENCOUNTER — Encounter: Payer: Self-pay | Admitting: Family Medicine

## 2017-11-18 ENCOUNTER — Other Ambulatory Visit: Payer: Self-pay

## 2017-11-18 ENCOUNTER — Ambulatory Visit: Payer: BLUE CROSS/BLUE SHIELD | Admitting: Family Medicine

## 2017-11-18 VITALS — BP 114/86 | HR 92 | Temp 98.2°F | Ht 64.0 in | Wt 211.2 lb

## 2017-11-18 DIAGNOSIS — Z119 Encounter for screening for infectious and parasitic diseases, unspecified: Secondary | ICD-10-CM

## 2017-11-18 DIAGNOSIS — Z205 Contact with and (suspected) exposure to viral hepatitis: Secondary | ICD-10-CM | POA: Diagnosis not present

## 2017-11-18 NOTE — Patient Instructions (Addendum)
IF you received an x-ray today, you will receive an invoice from [facility]. Please contact [facility] at [phone] with questions or concerns regarding your invoice.   IF you received labwork today, you will receive an invoice from [facility]. Please contact [facility] at [phone] with questions or concerns regarding your invoice.   Our billing staff will not be able to assist you with questions regarding bills from these companies.  You will be contacted with the lab results as soon as they are available. The fastest way to get your results is to activate your My Chart account. Instructions are located on the last page of this paperwork. If you have not heard from [place] regarding the results in 2 weeks, please contact this office.    Hepatitis B Antigen and Antibody Tests Why am I having this test? Testing for hepatitis B virus (HBV) can include checking your blood for infection by the virus (antigen) itself or for hepatitis B antibodies. Hepatitis B antibodies are infection-fighting proteins produced by your body's defense (immune) system in response to an infection with HBV. These antibodies are also produced after you have received the hepatitis B vaccine. Different types of HBV antibodies may be present in your blood, depending on how long it has been since infection or vaccination occurred. Different types of HBV antigens can also be detected. The hepatitis B antigen and antibody tests detect the presence of these antigens or antibodies. These tests may be done:  If there is concern that you have been exposed to hepatitis B.  To diagnose hepatitis B infection.  If you have long-term (chronic) liver disease or abnormal liver function test results. This test may help to find the cause.  To see if you are already protected from (immune to) hepatitis B.  To see if you need the hepatitis B vaccine to protect you from future infection.  What kind of sample is  taken? A blood sample is required for the hepatitis B antigen and antibody tests. It is usually collected by inserting a needle into a vein. How do I prepare for this test? There is no preparation required for this test. What do the results mean? It is your responsibility to obtain your test results. Ask the lab or department performing the test when and how you will get your results. Talk with your health care provider if you have any questions about your results. The test results are based on which antibodies your blood has (is positive for) and which antibodies your blood does not have (is negative for). The test results are also based on whether hepatitis B antigen molecules are found in your blood. Normal Test Results The test result is normal if it matches one of these descriptions:  Negative HBsAg and HBeAg. This means no HBV antigen is present in your blood.  Positive HBsAb. This means you do not have an active infection but that you are protected from HBV from past exposure or from vaccination.  Abnormal Test Results The test result is abnormal and may indicate current or recent HBV infection if it matches one of these descriptions:  Positive HBeAg and HBsAg. This means you have a current HBV infection.  Positive HBVc-Ab/IgM or HBVc-Ab total. This means you have a current, previous, or long-term HBV infection.  Discuss your test results with your health care provider. He or she will use the results to make a diagnosis and determine a treatment plan that is right for you. Talk with your  health care provider to discuss your results, treatment options, and if necessary, the need for more tests. Talk with your health care provider if you have any questions about your results. This information is not intended to replace advice given to you by your health care provider. Make sure you discuss any questions you have with your health care provider. Document Released: 07/03/2004 Document  Revised: 02/12/2016 Document Reviewed: 10/24/2013 Elsevier Interactive Patient Education  2018 [employer].

## 2017-11-18 NOTE — Progress Notes (Signed)
5/30/201912:25 PM  Erin Glenn 1971/02/19, 47 y.o. female [id number]  Chief Complaint  Patient presents with  . husband became exposed to hep B at work. Works at [employer] is not sure of onset, did go to [place] in April, received vaccines needed    HPI:   Patient is a 47 y.o. female who presents today requesting testing for hepatitis and HIV  She reports her husband was called yesterday and told he has "trace" of hepatitis B She is unclear on details, knows her husband has been referred to a specialist She reports that he was immunized in Oct, she believes that was a hep b vaccine  Patient reports she has never been immunized   Fall Risk  11/18/2017 12/24/2016 11/12/2015  Falls in the past year? No No No     Depression screen [facility] 2/9 11/18/2017 09/21/2017 12/24/2016  Decreased Interest 0 3 0  Down, Depressed, Hopeless 0 0 0  PHQ - 2 Score 0 3 0  Altered sleeping - 0 -  Tired, decreased energy - 0 -  Change in appetite - 0 -  Feeling bad or failure about yourself  - 0 -  Trouble concentrating - 0 -  Moving slowly or fidgety/restless - 0 -  Suicidal thoughts - 0 -  PHQ-9 Score - 3 -    Allergies  Allergen Reactions  . Hydrocodone-Acetaminophen Nausea Only    Prior to Admission medications   Medication Sig Start Date End Date Taking? Authorizing Provider  furosemide (LASIX) 20 MG tablet Take 1 tablet (20 mg total) by mouth daily. Patient not taking: Reported on 11/18/2017 09/21/17   Clent Demark, PA-C  hydrochlorothiazide (HYDRODIURIL) 12.5 MG tablet TAKE 1/2 TABLET BY MOUTH DAILY, MAY INCREASE TO 1 TABLET DAILY IF BLOOD PRESSURE IS> 140/ 90 Patient not taking: Reported on 11/18/2017 10/05/17   Clent Demark, PA-C  naproxen (NAPROSYN) 500 MG tablet Take 1 tablet (500 mg total) by mouth 2 (two) times daily with a meal. Patient not taking: Reported on 11/18/2017 09/21/17   Clent Demark, PA-C    Past Medical History:  Diagnosis Date  . Fibroid  uterus 2014  . Unspecified vitamin D deficiency 09/26/2013   urgent care at [facility]    History reviewed. No pertinent surgical history.  Social History   Tobacco Use  . Smoking status: Never Smoker  . Smokeless tobacco: Never Used  Substance Use Topics  . Alcohol use: No    Family History  Problem Relation Age of Onset  . Diabetic kidney disease Mother   . Hypertension Mother   . Stroke Mother   . Heart disease Mother   . Hyperlipidemia Mother   . Diabetes Mother   . Alcoholism Father   . Hypertension Father   . Cancer - Other Father 62       throat  . Diabetes Father     Review of Systems  Constitutional: Negative for chills, fever, malaise/fatigue and weight loss.  Gastrointestinal: Negative for abdominal pain, nausea and vomiting.    OBJECTIVE:  Blood pressure 114/86, pulse 92, temperature 98.2 F (36.8 C), temperature source Bladder, height 5\' 4"  (1.626 m), weight 211 lb 3.2 oz (95.8 kg).  Physical Exam  Constitutional: She is oriented to person, place, and time. She appears well-developed and well-nourished.  HENT:  Head: Normocephalic and atraumatic.  Mouth/Throat: Mucous membranes are normal.  Eyes: Pupils are equal, round, and reactive to light. Conjunctivae and EOM are normal.  No scleral icterus.  Neck: Neck supple.  Pulmonary/Chest: Effort normal.  Neurological: She is alert and oriented to person, place, and time.  Skin: Skin is warm and dry.  Psychiatric: She has a normal mood and affect.  Nursing note and vitals reviewed.    ASSESSMENT and PLAN 1. Exposure to hepatitis B 2. Screening examination for infectious disease Discussed safe household practices incl safe sex. Reviewed RTC precautions. Consider immunizations. - HIV antibody - Acute Hep Panel & Hep B Surface Ab    Return if symptoms worsen or fail to improve.    Rutherford Guys, MD [facility] [place], [place] [address] Ph.  [phone] Fax  [phone]

## 2017-11-19 LAB — ACUTE HEP PANEL AND HEP B SURFACE AB
Hep A IgM: NEGATIVE
Hep B C IgM: NEGATIVE
Hep C Virus Ab: <0.1 s/co ratio (ref 0.0–0.9)
Hepatitis B Surf Ab Quant: <3.1 m[IU]/mL — ABNORMAL LOW (ref >9.9)
Hepatitis B Surface Ag: NEGATIVE

## 2017-11-19 LAB — HIV ANTIBODY (ROUTINE TESTING W REFLEX): HIV Screen 4th Generation wRfx: NONREACTIVE

## 2017-12-09 ENCOUNTER — Ambulatory Visit: Payer: BLUE CROSS/BLUE SHIELD | Attending: Internal Medicine | Admitting: Internal Medicine

## 2017-12-09 ENCOUNTER — Encounter: Payer: Self-pay | Admitting: Internal Medicine

## 2017-12-09 VITALS — BP 128/85 | HR 84 | Temp 97.1°F | Resp 16 | Ht 64.0 in | Wt 214.4 lb

## 2017-12-09 DIAGNOSIS — N951 Menopausal and female climacteric states: Secondary | ICD-10-CM | POA: Insufficient documentation

## 2017-12-09 DIAGNOSIS — E559 Vitamin D deficiency, unspecified: Secondary | ICD-10-CM | POA: Insufficient documentation

## 2017-12-09 DIAGNOSIS — E669 Obesity, unspecified: Secondary | ICD-10-CM | POA: Diagnosis not present

## 2017-12-09 DIAGNOSIS — R7303 Prediabetes: Secondary | ICD-10-CM | POA: Diagnosis not present

## 2017-12-09 DIAGNOSIS — I10 Essential (primary) hypertension: Secondary | ICD-10-CM

## 2017-12-09 DIAGNOSIS — Z8249 Family history of ischemic heart disease and other diseases of the circulatory system: Secondary | ICD-10-CM | POA: Insufficient documentation

## 2017-12-09 DIAGNOSIS — Z7689 Persons encountering health services in other specified circumstances: Secondary | ICD-10-CM | POA: Insufficient documentation

## 2017-12-09 DIAGNOSIS — Z79899 Other long term (current) drug therapy: Secondary | ICD-10-CM | POA: Diagnosis not present

## 2017-12-09 DIAGNOSIS — D259 Leiomyoma of uterus, unspecified: Secondary | ICD-10-CM | POA: Diagnosis not present

## 2017-12-09 DIAGNOSIS — Z885 Allergy status to narcotic agent status: Secondary | ICD-10-CM | POA: Insufficient documentation

## 2017-12-09 DIAGNOSIS — Z6836 Body mass index (BMI) 36.0-36.9, adult: Secondary | ICD-10-CM | POA: Diagnosis not present

## 2017-12-09 NOTE — Patient Instructions (Signed)
You are doing great.  Keep up the good works.  Please record blood pressure readings and bring in on next visit.   Follow a Healthy Eating Plan - You can do it! Limit sugary drinks.  Avoid sodas, sweet tea, sport or energy drinks, or fruit drinks.  Drink water, lo-fat milk, or diet drinks. Limit snack foods.   Cut back on candy, cake, cookies, chips, ice cream.  These are a special treat, only in small amounts. Eat plenty of vegetables.  Especially dark green, red, and orange vegetables. Aim for at least 3 servings a day. More is better! Include fruit in your daily diet.  Whole fruit is much healthier than fruit juice! Limit "white" bread, "white" pasta, "white" rice.   Choose "100% whole grain" products, brown or wild rice. Avoid fatty meats. Try "Meatless Monday" and choose eggs or beans one day a week.  When eating meat, choose lean meats like chicken, [place], and fish.  Grill, broil, or bake meats instead of frying, and eat poultry without the skin. Eat less salt.  Avoid frozen pizzas, frozen dinners and salty foods.  Use seasonings other than salt in cooking.  This can help blood pressure and keep you from swelling Beer, wine and liquor have calories.  If you can safely drink alcohol, limit to 1 drink per day for women, 2 drinks for men

## 2017-12-09 NOTE — Progress Notes (Signed)
Patient ID: Erin Glenn, female    DOB: [birth year]  MRN: [id number]  CC: re-estbalish and Hypertension   Subjective: Erin Glenn is a 47 y.o. female who presents for chronic ds management and to establish with me as PCP Her concerns today include:  HTN, vit D def, fibroids s/p ablation, pre-DM  Obesity: Patient reports that she was not pleased with her previous PCP who has left the practice.  She states that she needed help with losing weight and did not find her advice helpful.   -She discussed the same with her gynecologist who was amendable to putting her on phentermine for 3 months starting in August of last year.  Along with the phentermine she started doing weight watchers diet and is on a 1200 cal/day dietary restriction.  She also goes to the gym 5 days a week and works with a Clinical research associate.  She does cardio for 1 hour 5 days a week and some weight training 2 days a week.  So far she has loss 38 pounds.  Her goal is to lose 100 pounds total  On Black Cohash for hot flash and an herbal product called yams.  Her gynecologist had placed her on estradiol patch along with progesterone for hot flashes but she has not started taking them as yet as she wishes to do things naturally   HTN: She is on hydrochlorothiazide 12.5 mg daily.  She limits salt in foods.    Patient Active Problem List   Diagnosis Date Noted  . Essential hypertension 07/13/2015  . Fibroid uterus 10/11/2013  . Irregular periods/menstrual cycles 10/03/2013     Current Outpatient Medications on File Prior to Visit  Medication Sig Dispense Refill  . hydrochlorothiazide (HYDRODIURIL) 12.5 MG tablet   2   No current facility-administered medications on file prior to visit.     Allergies  Allergen Reactions  . Hydrocodone-Acetaminophen Nausea Only    Social History   Socioeconomic History  . Marital status: Married    Spouse name: Not on file  . Number of children: Not on file  . Years of education: Not  on file  . Highest education level: Not on file  Occupational History  . Not on file  Social Needs  . Financial resource strain: Not on file  . Food insecurity:    Worry: Not on file    Inability: Not on file  . Transportation needs:    Medical: Not on file    Non-medical: Not on file  Tobacco Use  . Smoking status: Never Smoker  . Smokeless tobacco: Never Used  Substance and Sexual Activity  . Alcohol use: No  . Drug use: No  . Sexual activity: Yes    Birth control/protection: None    Comment: G0 P0  Lifestyle  . Physical activity:    Days per week: Not on file    Minutes per session: Not on file  . Stress: Not on file  Relationships  . Social connections:    Talks on phone: Not on file    Gets together: Not on file    Attends religious service: Not on file    Active member of club or organization: Not on file    Attends meetings of clubs or organizations: Not on file    Relationship status: Not on file  . Intimate partner violence:    Fear of current or ex partner: Not on file    Emotionally abused: Not on file    Physically  abused: Not on file    Forced sexual activity: Not on file  Other Topics Concern  . Not on file  Social History Narrative  . Not on file    Family History  Problem Relation Age of Onset  . Diabetic kidney disease Mother   . Hypertension Mother   . Stroke Mother   . Heart disease Mother   . Hyperlipidemia Mother   . Diabetes Mother   . Alcoholism Father   . Hypertension Father   . Cancer - Other Father 62       throat  . Diabetes Father     No past surgical history on file.  ROS: Review of Systems Negative except as stated above PHYSICAL EXAM: BP 128/85   Pulse 84   Temp (!) 97.1 F (36.2 C) (Oral)   Resp 16   Ht 5\' 4"  (1.626 m)   Wt 214 lb 6.4 oz (97.3 kg)   SpO2 99%   BMI 36.80 kg/m   Wt Readings from Last 3 Encounters:  12/09/17 214 lb 6.4 oz (97.3 kg)  11/18/17 211 lb 3.2 oz (95.8 kg)  09/21/17 217 lb (98.4 kg)      Physical Exam  General appearance - alert, well appearing, and in no distress Mental status - normal mood, behavior, speech, dress, motor activity, and thought processes Neck - supple, no significant adenopathy Chest - clear to auscultation, no wheezes, rales or rhonchi, symmetric air entry Heart - normal rate, regular rhythm, normal S1, S2, no murmurs, rubs, clicks or gallops Extremities - peripheral pulses normal, no pedal edema, no clubbing or cyanosis     Chemistry      Component Value Date/Time   NA 142 09/21/2017 1045   K 3.9 09/21/2017 1045   CL 104 09/21/2017 1045   CO2 22 09/21/2017 1045   BUN 12 09/21/2017 1045   CREATININE 0.58 09/21/2017 1045   CREATININE 0.65 07/09/2015 2110      Component Value Date/Time   CALCIUM 9.3 09/21/2017 1045   ALKPHOS 86 09/21/2017 1045   AST 17 09/21/2017 1045   ALT 16 09/21/2017 1045   BILITOT 0.2 09/21/2017 1045     Lab Results  Component Value Date   WBC 7.0 09/21/2017   HGB 12.5 09/21/2017   HCT 36.9 09/21/2017   MCV 78 (L) 09/21/2017   PLT 331 09/21/2017    ASSESSMENT AND PLAN: 1. Hypertension, unspecified type Not at goal which is 130/80 or lower.  She will continue HCTZ.  Advised her to check blood pressure a few times a week at the gym.  Bring in readings on follow-up visit.  2. Obesity (BMI 35.0-39.9 without comorbidity) Commended her on weight loss so far.  Encouraged her to keep up the good work with calorie restricted diet and regular exercise.  She hopes to have lost more weight by her next visit.  Patient was given the opportunity to ask questions.  Patient verbalized understanding of the plan and was able to repeat key elements of the plan.   No orders of the defined types were placed in this encounter.    Requested Prescriptions    No prescriptions requested or ordered in this encounter    Return in about 2 months (around 02/08/2018).  Karle Plumber, MD, FACP

## 2017-12-09 NOTE — Progress Notes (Signed)
Pt states she Hydrochlorothiazide but doesn't remember the dosage

## 2018-01-27 DIAGNOSIS — Z6836 Body mass index (BMI) 36.0-36.9, adult: Secondary | ICD-10-CM | POA: Diagnosis not present

## 2018-01-27 DIAGNOSIS — Z713 Dietary counseling and surveillance: Secondary | ICD-10-CM | POA: Diagnosis not present

## 2018-02-08 ENCOUNTER — Encounter: Payer: Self-pay | Admitting: Internal Medicine

## 2018-02-08 ENCOUNTER — Ambulatory Visit: Payer: BLUE CROSS/BLUE SHIELD | Attending: Internal Medicine | Admitting: Internal Medicine

## 2018-02-08 VITALS — BP 129/91 | HR 75 | Temp 98.6°F | Resp 16 | Wt 211.6 lb

## 2018-02-08 DIAGNOSIS — E559 Vitamin D deficiency, unspecified: Secondary | ICD-10-CM | POA: Diagnosis not present

## 2018-02-08 DIAGNOSIS — I10 Essential (primary) hypertension: Secondary | ICD-10-CM

## 2018-02-08 DIAGNOSIS — Z6836 Body mass index (BMI) 36.0-36.9, adult: Secondary | ICD-10-CM | POA: Diagnosis not present

## 2018-02-08 DIAGNOSIS — R7303 Prediabetes: Secondary | ICD-10-CM | POA: Insufficient documentation

## 2018-02-08 DIAGNOSIS — Z79899 Other long term (current) drug therapy: Secondary | ICD-10-CM | POA: Diagnosis not present

## 2018-02-08 DIAGNOSIS — D259 Leiomyoma of uterus, unspecified: Secondary | ICD-10-CM | POA: Diagnosis not present

## 2018-02-08 DIAGNOSIS — E669 Obesity, unspecified: Secondary | ICD-10-CM

## 2018-02-08 NOTE — Progress Notes (Signed)
Patient ID: JULIANNE CHAMBERLIN, female    DOB: 1971-06-20  MRN: [id number]  CC: Hypertension   Subjective: Erin Glenn is a 47 y.o. female who presents for routine follow-up visit on high blood pressure and obesity Her concerns today include:  HTN, vit D def, fibroids s/p ablation, pre-DM  Obesity: She is still doing well in achieving weight loss.  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   Drinking 8 bottles of water a day Still working out 5 days a wk Doing a Herbal Life product that is a detox.  She has set goal to be 150 by the end of this yr.  Started at 260 lbs 1 yr ago.  She shows me pictures of herself from a year ago compared to now.  HTN:  Takes HCTZ but not consistently because her home blood pressure readings have been good.  She gives a range of 119-120s/70s She limits salt in the foods.  She does well with her eating habits. Patient Active Problem List   Diagnosis Date Noted  . Essential hypertension 07/13/2015  . Fibroid uterus 10/11/2013  . Irregular periods/menstrual cycles 10/03/2013     Current Outpatient Medications on File Prior to Visit  Medication Sig Dispense Refill  . hydrochlorothiazide (HYDRODIURIL) 12.5 MG tablet   2   No current facility-administered medications on file prior to visit.     Allergies  Allergen Reactions  . Hydrocodone-Acetaminophen Nausea Only    Social History   Socioeconomic History  . Marital status: Married    Spouse name: Not on file  . Number of children: Not on file  . Years of education: Not on file  . Highest education level: Not on file  Occupational History  . Not on file  Social Needs  . Financial resource strain: Not on file  . Food insecurity:    Worry: Not on file    Inability: Not on file  . Transportation needs:    Medical: Not on file    Non-medical: Not on file  Tobacco Use  . Smoking status: Never Smoker  .  Smokeless tobacco: Never Used  Substance and Sexual Activity  . Alcohol use: No  . Drug use: No  . Sexual activity: Yes    Birth control/protection: None    Comment: G0 P0  Lifestyle  . Physical activity:    Days per week: Not on file    Minutes per session: Not on file  . Stress: Not on file  Relationships  . Social connections:    Talks on phone: Not on file    Gets together: Not on file    Attends religious service: Not on file    Active member of club or organization: Not on file    Attends meetings of clubs or organizations: Not on file    Relationship status: Not on file  . Intimate partner violence:    Fear of current or ex partner: Not on file    Emotionally abused: Not on file    Physically abused: Not on file    Forced sexual activity: Not on file  Other Topics Concern  . Not on file  Social History Narrative  . Not on file    Family History  Problem Relation Age of Onset  . Diabetic kidney disease Mother   . Hypertension Mother   . Stroke Mother   . Heart disease  Mother   . Hyperlipidemia Mother   . Diabetes Mother   . Alcoholism Father   . Hypertension Father   . Cancer - Other Father 62       throat  . Diabetes Father     No past surgical history on file.  ROS: Review of Systems  PHYSICAL EXAM: BP (!) 129/91   Pulse 75   Temp 98.6 F (37 C) (Oral)   Resp 16   Wt 211 lb 9.6 oz (96 kg)   SpO2 95%   BMI 36.32 kg/m   Wt Readings from Last 3 Encounters:  02/08/18 211 lb 9.6 oz (96 kg)  12/09/17 214 lb 6.4 oz (97.3 kg)  11/18/17 211 lb 3.2 oz (95.8 kg)    Physical Exam  General appearance - alert, well appearing, and in no distress Mental status - normal mood, behavior, speech, dress, motor activity, and thought processes Neck - supple, no significant adenopathy Chest - clear to auscultation, no wheezes, rales or rhonchi, symmetric air entry Heart - normal rate, regular rhythm, normal S1, S2, no murmurs, rubs, clicks or  gallops Extremities - peripheral pulses normal, no pedal edema, no clubbing or cyanosis  ASSESSMENT AND PLAN: 1. Hypertension, unspecified type Reading in the office today is not at goal of 130/80 or lower.  However she does report normal readings at home and she has loss over 50 pounds over the past year.  Patient advised of blood pressure goal of 130/80 or lower.  As long as she is running consistently lower than that she can hold off on taking the HCTZ  2. Obesity (BMI 35.0-39.9 without comorbidity) Commended her on her weight loss so far and encouraged her to continue healthy eating and regular exercise.  I gave my opinion on detox products.  I really do not think that they are of any benefit.  Patient was given the opportunity to ask questions.  Patient verbalized understanding of the plan and was able to repeat key elements of the plan.   No orders of the defined types were placed in this encounter.    Requested Prescriptions    No prescriptions requested or ordered in this encounter    Return in about 4 months (around 06/10/2018).  Karle Plumber, MD, FACP

## 2018-02-08 NOTE — Patient Instructions (Signed)
Keep up the good works with healthy eating habits and regular exercise.

## 2018-02-16 ENCOUNTER — Other Ambulatory Visit: Payer: BLUE CROSS/BLUE SHIELD

## 2018-02-16 ENCOUNTER — Ambulatory Visit
Admission: RE | Admit: 2018-02-16 | Discharge: 2018-02-16 | Disposition: A | Discharge status: Home / Self Care | Payer: BLUE CROSS/BLUE SHIELD | Source: Ambulatory Visit | Attending: Obstetrics and Gynecology | Admitting: Obstetrics and Gynecology

## 2018-02-16 DIAGNOSIS — N6489 Other specified disorders of breast: Secondary | ICD-10-CM | POA: Diagnosis not present

## 2018-02-16 DIAGNOSIS — R928 Other abnormal and inconclusive findings on diagnostic imaging of breast: Secondary | ICD-10-CM | POA: Diagnosis not present

## 2018-02-16 DIAGNOSIS — N641 Fat necrosis of breast: Secondary | ICD-10-CM

## 2018-02-22 ENCOUNTER — Telehealth: Payer: Self-pay | Admitting: Internal Medicine

## 2018-02-22 MED ORDER — HYDROCHLOROTHIAZIDE 12.5 MG PO TABS: 12.5000 mg | ORAL_TABLET | Freq: Every day | ORAL | 2 refills | Status: DC

## 2018-02-22 NOTE — Telephone Encounter (Signed)
1) Medication(s) Requested (by name): hydrochlorothiazide (HYDRODIURIL) 12.5 MG tablet [[id number]]   2) Pharmacy of Choice: [facility] [address] 3) Special Requests: Patient was seen 02/08/18, patient states pharmacy has reached out to [place] and she has been without medication for almost a week.  Approved medications will be sent to the pharmacy, we will reach out if there is an issue.

## 2018-04-07 DIAGNOSIS — Z713 Dietary counseling and surveillance: Secondary | ICD-10-CM | POA: Diagnosis not present

## 2018-04-07 DIAGNOSIS — Z6836 Body mass index (BMI) 36.0-36.9, adult: Secondary | ICD-10-CM | POA: Diagnosis not present

## 2018-04-26 DIAGNOSIS — Z6836 Body mass index (BMI) 36.0-36.9, adult: Secondary | ICD-10-CM | POA: Diagnosis not present

## 2018-04-26 DIAGNOSIS — Z713 Dietary counseling and surveillance: Secondary | ICD-10-CM | POA: Diagnosis not present

## 2018-08-11 ENCOUNTER — Ambulatory Visit: Payer: Self-pay

## 2018-09-14 ENCOUNTER — Telehealth: Payer: Self-pay

## 2018-09-14 NOTE — Telephone Encounter (Signed)
Patient transferred to me by [place] at the front desk. She is asking for a refill on Phentermine 37.5mg  tablets she has been receiving them [facility] Dr. Arvella Nigh, she states that it helps to manage her menopause symptoms and she is very uncomfortable. I told her since it has been so long since her last appt we may not be able to accomodate her request.  Pt last seen: 02/08/18 Next appt: n/a Last RX written on: 08/09/18 Date of original fill: 08/09/18 Date of refill(s): n/a  No other controlled substances were filled during this time, please refill if appropriate.

## 2018-09-14 NOTE — Telephone Encounter (Signed)
She might have to get it from her clinician [facility] the [facility] as her PCP has not been prescribing that for her.

## 2018-11-24 ENCOUNTER — Other Ambulatory Visit: Payer: Self-pay | Admitting: Internal Medicine

## 2018-11-26 ENCOUNTER — Other Ambulatory Visit: Payer: Self-pay | Admitting: Internal Medicine

## 2018-11-29 ENCOUNTER — Other Ambulatory Visit: Payer: Self-pay

## 2018-11-29 ENCOUNTER — Encounter: Payer: Self-pay | Admitting: Internal Medicine

## 2018-11-29 ENCOUNTER — Ambulatory Visit: Payer: Self-pay | Attending: Internal Medicine | Admitting: Internal Medicine

## 2018-11-29 VITALS — BP 114/86 | HR 67 | Temp 97.9°F | Resp 16 | Wt 215.0 lb

## 2018-11-29 DIAGNOSIS — Z1239 Encounter for other screening for malignant neoplasm of breast: Secondary | ICD-10-CM

## 2018-11-29 DIAGNOSIS — E669 Obesity, unspecified: Secondary | ICD-10-CM | POA: Insufficient documentation

## 2018-11-29 DIAGNOSIS — I10 Essential (primary) hypertension: Secondary | ICD-10-CM

## 2018-11-29 MED ORDER — HYDROCHLOROTHIAZIDE 12.5 MG PO TABS: 12.5000 mg | ORAL_TABLET | Freq: Every day | ORAL | 2 refills | Status: DC

## 2018-11-29 MED ORDER — PHENTERMINE HCL 30 MG PO TBDP: 30.0000 mg | ORAL_TABLET | Freq: Every day | ORAL | 1 refills | Status: DC

## 2018-11-29 NOTE — Progress Notes (Signed)
Patient ID: Erin Glenn, female    DOB: 1971-03-06  MRN: [id number]  CC: Hypertension   Subjective: Erin Glenn is a 48 y.o. female who presents for neck disease management. Her concerns today include:  Patient with history of HTN, obesity, pre-menopausal, pre-DM, fibroids s/p ablation, vit D def  Pt reports her insurance has changed to [place] that requires her to est care with [facility]. She feels that her support system in terms of her doctors and [place] "is gone." [employer] was covered under her past coverage.   Also recently laid off from work.  She does not wish to establish with providers in [facility] as she lives in [place].  She is talking with her insurance trying to have it switched back to providers in [place].  This is causing her a lot of stress.  HTN:  Out of HCTZx 2 wks. she limits salt in the foods.  She denies any chest pains or shortness of breath.  No lower extremity edema.  No chronic headaches.  Obesity: Reports that she continues to try to eat healthy and stay active.  Her gym reopened 2 wks ago.  She started going to the gym again and is working with a trainer there 5 days a week.  She was on weight watchers and meals were being paid for through her insurance.  Now that she is lost that coverage she feels she would benefit from having an appetite suppressant for 2 to 3 months.  Was on phentermine in the past through her gynecologist.  She last took it 01/2018.  She would like to be placed back on it for short period.   Patient Active Problem List   Diagnosis Date Noted  . Essential hypertension 07/13/2015  . Fibroid uterus 10/11/2013  . Irregular periods/menstrual cycles 10/03/2013     No current outpatient medications on file prior to visit.   No current facility-administered medications on file prior to visit.     Allergies  Allergen Reactions  . Hydrocodone-Acetaminophen Nausea Only    Social History    Socioeconomic History  . Marital status: Married    Spouse name: Not on file  . Number of children: Not on file  . Years of education: Not on file  . Highest education level: Not on file  Occupational History  . Not on file  Social Needs  . Financial resource strain: Not on file  . Food insecurity:    Worry: Not on file    Inability: Not on file  . Transportation needs:    Medical: Not on file    Non-medical: Not on file  Tobacco Use  . Smoking status: Never Smoker  . Smokeless tobacco: Never Used  Substance and Sexual Activity  . Alcohol use: No  . Drug use: No  . Sexual activity: Yes    Birth control/protection: None    Comment: G0 P0  Lifestyle  . Physical activity:    Days per week: Not on file    Minutes per session: Not on file  . Stress: Not on file  Relationships  . Social connections:    Talks on phone: Not on file    Gets together: Not on file    Attends religious service: Not on file    Active member of club or organization: Not on file    Attends meetings of clubs or organizations: Not on file    Relationship status: Not on file  .  Intimate partner violence:    Fear of current or ex partner: Not on file    Emotionally abused: Not on file    Physically abused: Not on file    Forced sexual activity: Not on file  Other Topics Concern  . Not on file  Social History Narrative  . Not on file    Family History  Problem Relation Age of Onset  . Diabetic kidney disease Mother   . Hypertension Mother   . Stroke Mother   . Heart disease Mother   . Hyperlipidemia Mother   . Diabetes Mother   . Alcoholism Father   . Hypertension Father   . Cancer - Other Father 62       throat  . Diabetes Father     No past surgical history on file.  ROS: Review of Systems Negative except as stated above  PHYSICAL EXAM: BP 114/86   Pulse 67   Temp 97.9 F (36.6 C) (Oral)   Resp 16   Wt 215 lb (97.5 kg)   SpO2 99%   BMI 36.90 kg/m   Wt Readings from  Last 3 Encounters:  11/29/18 215 lb (97.5 kg)  02/08/18 211 lb 9.6 oz (96 kg)  12/09/17 214 lb 6.4 oz (97.3 kg)    Physical Exam General appearance - alert, well appearing, middle-aged obese African-American female and in no distress Mental status - normal mood, behavior, speech, dress, motor activity, and thought processes Mouth - mucous membranes moist, pharynx normal without lesions Neck - supple, no significant adenopathy Chest - clear to auscultation, no wheezes, rales or rhonchi, symmetric air entry Heart - normal rate, regular rhythm, normal S1, S2, no murmurs, rubs, clicks or gallops Extremities - peripheral pulses normal, no pedal edema, no clubbing or cyanosis   CMP Latest Ref Rng & Units 09/21/2017 12/17/2016 07/09/2015  Glucose 65 - 99 mg/dL 91 80 93  BUN 6 - 24 mg/dL 12 7 9   Creatinine 0.57 - 1.00 mg/dL 0.58 0.63 0.65  Sodium 134 - 144 mmol/L 142 140 138  Potassium 3.5 - 5.2 mmol/L 3.9 3.9 4.0  Chloride 96 - 106 mmol/L 104 102 103  CO2 20 - 29 mmol/L 22 23 26   Calcium 8.7 - 10.2 mg/dL 9.3 9.3 9.4  Total Protein 6.0 - 8.5 g/dL 7.5 7.7 7.3  Total Bilirubin 0.0 - 1.2 mg/dL 0.2 0.2 0.2  Alkaline Phos 39 - 117 IU/L 86 99 70  AST 0 - 40 IU/L 17 17 18   ALT 0 - 32 IU/L 16 18 19    Lipid Panel  No results found for: CHOL, TRIG, HDL, CHOLHDL, VLDL, LDLCALC, LDLDIRECT  CBC    Component Value Date/Time   WBC 7.0 09/21/2017 1045   WBC 8.6 07/09/2015 2118   RBC 4.75 09/21/2017 1045   RBC 4.81 07/09/2015 2118   HGB 12.5 09/21/2017 1045   HCT 36.9 09/21/2017 1045   PLT 331 09/21/2017 1045   MCV 78 (L) 09/21/2017 1045   MCH 26.3 (L) 09/21/2017 1045   MCH 25.3 (A) 07/09/2015 2118   MCHC 33.9 09/21/2017 1045   MCHC 33.8 07/09/2015 2118   RDW 15.2 09/21/2017 1045   LYMPHSABS 1.8 09/21/2017 1045   EOSABS 0.2 09/21/2017 1045   BASOSABS 0.0 09/21/2017 1045    ASSESSMENT AND PLAN: 1. Obesity (BMI 35.0-39.9 without comorbidity) Commended her on her commitment to regular  exercise.  Advised moderate intensity exercise at least 4 days a week for 30 minutes or more.  Dietary counseling given.  Will use phentermine for 2 to 3 months provided we see a good response in terms of weight loss.  I will have her come back to follow-up with me in 6 weeks. - Phentermine HCl 30 MG TBDP; Take 30 mg by mouth daily.  Dispense: 30 tablet; Refill: 1  2. Essential hypertension Not at goal of 130/80 or lower.  Refill hydrochlorothiazide.  Continue low-salt diet - CBC - Comprehensive metabolic panel - Lipid panel - hydrochlorothiazide (HYDRODIURIL) 12.5 MG tablet; Take 1 tablet (12.5 mg total) by mouth daily.  Dispense: 90 tablet; Refill: 2  3. Breast cancer screening - MM Digital Screening; Future   Patient was given the opportunity to ask questions.  Patient verbalized understanding of the plan and was able to repeat key elements of the plan.   Orders Placed This Encounter  Procedures  . MM Digital Screening  . CBC  . Comprehensive metabolic panel  . Lipid panel     Requested Prescriptions   Signed Prescriptions Disp Refills  . Phentermine HCl 30 MG TBDP 30 tablet 1    Sig: Take 30 mg by mouth daily.  . hydrochlorothiazide (HYDRODIURIL) 12.5 MG tablet 90 tablet 2    Sig: Take 1 tablet (12.5 mg total) by mouth daily.    Return in about 6 weeks (around 01/10/2019).  Karle Plumber, MD, FACP

## 2018-11-29 NOTE — Patient Instructions (Addendum)
Please call the BCCCP (breast and cervical cancer control program) at [phone] or [phone] to schedule diagnostic mammogram    Follow a Healthy Eating Plan - You can do it! Limit sugary drinks.  Avoid sodas, sweet tea, sport or energy drinks, or fruit drinks.  Drink water, lo-fat milk, or diet drinks. Limit snack foods.   Cut back on candy, cake, cookies, chips, ice cream.  These are a special treat, only in small amounts. Eat plenty of vegetables.  Especially dark green, red, and orange vegetables. Aim for at least 3 servings a day. More is better! Include fruit in your daily diet.  Whole fruit is much healthier than fruit juice! Limit "white" bread, "white" pasta, "white" rice.   Choose "100% whole grain" products, brown or wild rice. Avoid fatty meats. Try "Meatless Monday" and choose eggs or beans one day a week.  When eating meat, choose lean meats like chicken, [place], and fish.  Grill, broil, or bake meats instead of frying, and eat poultry without the skin. Eat less salt.  Avoid frozen pizzas, frozen dinners and salty foods.  Use seasonings other than salt in cooking.  This can help blood pressure and keep you from swelling Beer, wine and liquor have calories.  If you can safely drink alcohol, limit to 1 drink per day for women, 2 drinks for men

## 2018-11-30 LAB — CBC
Hematocrit: 38.1 % (ref 34.0–46.6)
Hemoglobin: 13.3 g/dL (ref 11.1–15.9)
MCH: 28.1 pg (ref 26.6–33.0)
MCHC: 34.9 g/dL (ref 31.5–35.7)
MCV: 81 fL (ref 79–97)
Platelets: 324 10*3/uL (ref 150–450)
RBC: 4.73 x10E6/uL (ref 3.77–5.28)
RDW: 14.8 % (ref 11.7–15.4)
WBC: 7 10*3/uL (ref 3.4–10.8)

## 2018-11-30 LAB — COMPREHENSIVE METABOLIC PANEL
ALT: 11 IU/L (ref 0–32)
AST: 14 IU/L (ref 0–40)
Albumin/Globulin Ratio: 1.4 (ref 1.2–2.2)
Albumin: 4.2 g/dL (ref 3.8–4.8)
Alkaline Phosphatase: 84 IU/L (ref 39–117)
BUN/Creatinine Ratio: 13 (ref 9–23)
BUN: 8 mg/dL (ref 6–24)
Bilirubin Total: 0.3 mg/dL (ref 0.0–1.2)
CO2: 23 mmol/L (ref 20–29)
Calcium: 9.5 mg/dL (ref 8.7–10.2)
Chloride: 104 mmol/L (ref 96–106)
Creatinine, Ser: 0.64 mg/dL (ref 0.57–1.00)
GFR calc Af Amer: 125 mL/min/{1.73_m2} (ref >59)
GFR calc non Af Amer: 108 mL/min/{1.73_m2} (ref >59)
Globulin, Total: 3 g/dL (ref 1.5–4.5)
Glucose: 88 mg/dL (ref 65–99)
Potassium: 4.3 mmol/L (ref 3.5–5.2)
Sodium: 138 mmol/L (ref 134–144)
Total Protein: 7.2 g/dL (ref 6.0–8.5)

## 2018-11-30 LAB — LIPID PANEL
Chol/HDL Ratio: 2.6 ratio (ref 0.0–4.4)
Cholesterol, Total: 166 mg/dL (ref 100–199)
HDL: 64 mg/dL (ref >39)
LDL Calculated: 87 mg/dL (ref 0–99)
Triglycerides: 74 mg/dL (ref 0–149)
VLDL Cholesterol Cal: 15 mg/dL (ref 5–40)

## 2019-01-06 ENCOUNTER — Telehealth: Payer: Self-pay

## 2019-01-06 ENCOUNTER — Other Ambulatory Visit: Payer: Self-pay

## 2019-01-06 ENCOUNTER — Encounter: Payer: Self-pay | Admitting: Internal Medicine

## 2019-01-06 ENCOUNTER — Ambulatory Visit: Payer: Self-pay | Attending: Internal Medicine | Admitting: Internal Medicine

## 2019-01-06 VITALS — BP 119/87 | Wt 206.4 lb

## 2019-01-06 DIAGNOSIS — Z6835 Body mass index (BMI) 35.0-35.9, adult: Secondary | ICD-10-CM

## 2019-01-06 DIAGNOSIS — E669 Obesity, unspecified: Secondary | ICD-10-CM

## 2019-01-06 MED ORDER — PHENTERMINE HCL 30 MG PO TBDP: 30.0000 mg | ORAL_TABLET | Freq: Every day | ORAL | 0 refills | Status: DC

## 2019-01-06 NOTE — Progress Notes (Signed)
Virtual Visit via Telephone Note Due to current restrictions/limitations of in-office visits due to the COVID-19 pandemic, this scheduled clinical appointment was converted to a telehealth visit  I connected with Erin Glenn on 01/06/19 at 2:55 p.m EDT by telephone and verified that I am speaking with the correct person using two identifiers. I am in my office.  The patient is at home.  Only the patient and myself participated in this encounter.  I discussed the limitations, risks, security and privacy concerns of performing an evaluation and management service by telephone and the availability of in person appointments. I also discussed with the patient that there may be a patient responsible charge related to this service. The patient expressed understanding and agreed to proceed.  History of Present Illness: Patient with history of HTN, obesity, pre-menopausal, pre-DM, fibroids s/p ablation, vit D def. patient was last seen 11/29/2018.  Today's visit is to follow-up on weight loss management   Obesity:  wgh today is 206.4 lbs.  Down 10 lbs since last visit. -does cardio 3 days a wk and some weight training.  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  Plans to get back to 5 days a wk once she gets back to day shift next wk.  -doing okay with her eating habits.  Restricting herself to 1000 cal/day while on night shift.  Following [employer] and [employer].   -tolerating Phentermine but out of it for 2 weeks.  I had written the prescription for 30 days with 1 refill.  However patient states that she was told by the pharmacy that by law it cannot have refills on it..   Outpatient Encounter Medications as of 01/06/2019  Medication Sig  . hydrochlorothiazide (HYDRODIURIL) 12.5 MG tablet Take 1 tablet (12.5 mg total) by mouth daily.  . Phentermine HCl 30 MG TBDP Take 30 mg by mouth daily.   No facility-administered encounter medications on file as of 01/06/2019.      Observations/Objective:  Results for orders placed or performed in visit on 11/29/18  CBC  Result Value Ref Range   WBC 7.0 3.4 - 10.8 x10E3/uL   RBC 4.73 3.77 - 5.28 x10E6/uL   Hemoglobin 13.3 11.1 - 15.9 g/dL   Hematocrit 38.1 34.0 - 46.6 %   MCV 81 79 - 97 fL   MCH 28.1 26.6 - 33.0 pg   MCHC 34.9 31.5 - 35.7 g/dL   RDW 14.8 11.7 - 15.4 %   Platelets 324 150 - 450 x10E3/uL  Comprehensive metabolic panel  Result Value Ref Range   Glucose 88 65 - 99 mg/dL   BUN 8 6 - 24 mg/dL   Creatinine, Ser 0.64 0.57 - 1.00 mg/dL   GFR calc non Af Amer 108 >59 mL/min/1.73   GFR calc Af Amer 125 >59 mL/min/1.73   BUN/Creatinine Ratio 13 9 - 23   Sodium 138 134 - 144 mmol/L   Potassium 4.3 3.5 - 5.2 mmol/L   Chloride 104 96 - 106 mmol/L   CO2 23 20 - 29 mmol/L   Calcium 9.5 8.7 - 10.2 mg/dL   Total Protein 7.2 6.0 - 8.5 g/dL   Albumin 4.2 3.8 - 4.8 g/dL   Globulin, Total 3.0 1.5 - 4.5 g/dL   Albumin/Globulin Ratio 1.4 1.2 - 2.2   Bilirubin Total 0.3 0.0 - 1.2 mg/dL   Alkaline Phosphatase 84 39 - 117 IU/L   AST 14 0 - 40 IU/L  ALT 11 0 - 32 IU/L  Lipid panel  Result Value Ref Range   Cholesterol, Total 166 100 - 199 mg/dL   Triglycerides 74 0 - 149 mg/dL   HDL 64 >39 mg/dL   VLDL Cholesterol Cal 15 5 - 40 mg/dL   LDL Calculated 87 0 - 99 mg/dL   Chol/HDL Ratio 2.6 0.0 - 4.4 ratio    Assessment and Plan: 1. Obesity (BMI 35.0-39.9 without comorbidity) Commended her on weight loss so far.  She has set a goal to get below 200 by next visit in 6 weeks.  I will have my nurse call in a refill on phentermine for her.  I have cautioned her about being too restrictive with her diet.  I think 1000 cal a day is a little too restrictive.  I recommend 1500 cal a day.  Continue regular exercise.  She plans to increase to 5 days a week again now that she is changing back to day shift. - Phentermine HCl 30 MG TBDP; Take 30 mg by mouth daily.  Dispense: 30 tablet; Refill: 0   Follow Up  Instructions: 6 wks   I discussed the assessment and treatment plan with the patient. The patient was provided an opportunity to ask questions and all were answered. The patient agreed with the plan and demonstrated an understanding of the instructions.   The patient was advised to call back or seek an in-person evaluation if the symptoms worsen or if the condition fails to improve as anticipated.  I provided 12 minutes of non-face-to-face time during this encounter.   Karle Plumber, MD

## 2019-01-06 NOTE — Telephone Encounter (Signed)
[facility] to call in Phentermine Rx. Spoke to [place]  Name: Phentermine Strength: 30MG  Sig: Take 1 tablet by mouth tablet daily Quantity: 30 Refills: 0

## 2019-02-02 ENCOUNTER — Telehealth: Payer: Self-pay | Admitting: Internal Medicine

## 2019-02-02 DIAGNOSIS — D219 Benign neoplasm of connective and other soft tissue, unspecified: Secondary | ICD-10-CM

## 2019-02-02 NOTE — Telephone Encounter (Signed)
Will forward to pcp tosubmit a referral

## 2019-02-02 NOTE — Telephone Encounter (Signed)
Patient called stating that her current OBGYN is out of network with her and is unsure where she can go because of her fibroid. Please follow up.

## 2019-02-03 ENCOUNTER — Ambulatory Visit: Payer: Medicaid Other | Admitting: Internal Medicine

## 2019-02-13 ENCOUNTER — Other Ambulatory Visit: Payer: Self-pay | Admitting: Internal Medicine

## 2019-02-13 DIAGNOSIS — I10 Essential (primary) hypertension: Secondary | ICD-10-CM

## 2019-02-22 ENCOUNTER — Ambulatory Visit: Payer: Medicaid Other

## 2019-02-22 NOTE — Progress Notes (Deleted)
Patient ID: Erin Glenn, female   DOB: 1970/10/13, 48 y.o.   MRN: [id number]

## 2019-02-23 ENCOUNTER — Ambulatory Visit: Payer: Self-pay | Attending: Internal Medicine | Admitting: Internal Medicine

## 2019-02-23 ENCOUNTER — Encounter: Payer: Self-pay | Admitting: Internal Medicine

## 2019-02-23 VITALS — Wt 201.3 lb

## 2019-02-23 DIAGNOSIS — I10 Essential (primary) hypertension: Secondary | ICD-10-CM

## 2019-02-23 DIAGNOSIS — E669 Obesity, unspecified: Secondary | ICD-10-CM

## 2019-02-23 MED ORDER — PHENTERMINE HCL 30 MG PO CAPS: 30.0000 mg | ORAL_CAPSULE | ORAL | 0 refills | Status: DC

## 2019-02-23 NOTE — Progress Notes (Signed)
Virtual Visit via Telephone Note Due to current restrictions/limitations of in-office visits due to the COVID-19 pandemic, this scheduled clinical appointment was converted to a telehealth visit  I connected with Erin Glenn on 02/23/19 at 3:54 p.m by telephone and verified that I am speaking with the correct person using two identifiers. I am in my office.  The patient is at home.  Only the patient and myself participated in this encounter.  I discussed the limitations, risks, security and privacy concerns of performing an evaluation and management service by telephone and the availability of in person appointments. I also discussed with the patient that there may be a patient responsible charge related to this service. The patient expressed understanding and agreed to proceed.   History of Present Illness: Patient with history of HTN, obesity, pre-menopausal, pre-DM, fibroids s/p ablation, vit D def.  This was 6 wks f/u on obesity  Feels fibroid weighing on her ovaries since she has loss wgh.  She can feel it when she presses on her abdomen.  She has scheduled appt with GYN  Obesity:  Has loss inches around her waist. -walking 5 days a wk for 1 hr, also does some wgh lifting. wgh down to 201 lb down 5 lbs since last visit -attended a wgh loss class to help with dietary changes -out of Phentermine x 2 wks. she is finding this helpful in decreasing her appetite.  She requests refill.  HTN: checks BP regularly at work  Last 2 readings were: 128/74, 119/90 She limits salt in the food.  No chest pains or shortness of breath.   Outpatient Encounter Medications as of 02/23/2019  Medication Sig  . hydrochlorothiazide (HYDRODIURIL) 12.5 MG tablet TAKE 1 TABLET BY MOUTH DAILY  . Phentermine HCl 30 MG TBDP Take 30 mg by mouth daily.   No facility-administered encounter medications on file as of 02/23/2019.       Observations/Objective: No direct observation done as this was a telephone  encounter.  Assessment and Plan: 1. Obesity (BMI 35.0-39.9 without comorbidity) Encouraged her to continue healthy eating habits and regular exercise.  She has lost further weight since we last evaluated her.  Refill given on phentermine for 1 month. - phentermine 30 MG capsule; Take 1 capsule (30 mg total) by mouth every morning.  Dispense: 30 capsule; Refill: 0  2. Essential hypertension Blood pressure at goal.  Continue HCTZ.   Follow Up Instructions: 4 mths in person   I discussed the assessment and treatment plan with the patient. The patient was provided an opportunity to ask questions and all were answered. The patient agreed with the plan and demonstrated an understanding of the instructions.   The patient was advised to call back or seek an in-person evaluation if the symptoms worsen or if the condition fails to improve as anticipated.  I provided 11 minutes of non-face-to-face time during this encounter.   Karle Plumber, MD

## 2019-03-09 ENCOUNTER — Ambulatory Visit (INDEPENDENT_AMBULATORY_CARE_PROVIDER_SITE_OTHER): Payer: BLUE CROSS/BLUE SHIELD | Admitting: Obstetrics and Gynecology

## 2019-03-09 ENCOUNTER — Other Ambulatory Visit: Payer: Self-pay

## 2019-03-09 ENCOUNTER — Encounter: Payer: Self-pay | Admitting: Obstetrics and Gynecology

## 2019-03-09 VITALS — BP 133/83 | HR 101 | Ht 64.0 in | Wt 209.9 lb

## 2019-03-09 DIAGNOSIS — R300 Dysuria: Secondary | ICD-10-CM | POA: Diagnosis not present

## 2019-03-09 DIAGNOSIS — D259 Leiomyoma of uterus, unspecified: Secondary | ICD-10-CM | POA: Diagnosis not present

## 2019-03-09 NOTE — Progress Notes (Signed)
Patient ID: Erin Glenn, female   DOB: 03-08-1971, 48 y.o.   MRN: [id number] Erin Glenn presents with request of follow up of her uterine fibroids. Dx with uterine fibroids in 2015. She had some left back and hip pain while running a few weeks ago and is wondering if pain is related to her fibroids. LMP 1 yr ago. Menopausal Sx Pap 07/2016 normal Sexual active without problems Some problems with urination recently.  PE AF VSS Lungs clear Heart RRR Abd soft + BS  Back no CVA tenderness GU nl EGBUS bladder non tender uterus enlarged 10-12 weeks, non tender no adnexal masses or tenderness  A/P Uterine fibroids        Dysuria  Pt has to reschedule her mammogram. Encouraged to do so. Will check GYN U/S. UC for dysuria. Informed pt I do not think near pain is related to her uterine fibroids. Suspect Erin related. Instructed on NSAID use as needed F/U per U/S results. Sign for release of medical records from prior OB/GYN

## 2019-03-09 NOTE — Progress Notes (Signed)
New pt here for annual exam. G0. Pt reports she no longer has periods. Last pap 2018, normal. Pt was scheduled for MMG, but the office canceled her appointment due to insurance change.

## 2019-03-09 NOTE — Patient Instructions (Signed)
Uterine Fibroids  Uterine fibroids are lumps of tissue (tumors) in your womb (uterus). They are not cancer (are benign). Most women with this condition do not need treatment. Sometimes fibroids can affect your ability to have children (your fertility). If that happens, you may need surgery to take out the fibroids. Follow these instructions at home:  Take over-the-counter and prescription medicines only as told by your doctor. Your doctor may suggest NSAIDs (such as aspirin or ibuprofen) to help with pain.  Ask your doctor if you should: ? Take iron pills. ? Eat more foods that have iron in them, such as dark green, leafy vegetables.  If directed, apply heat to your back or belly to reduce pain. Use the heat source that your doctor recommends, such as a moist heat pack or a heating pad. ? Put a towel between your skin and the heat source. ? Leave the heat on for 20-30 minutes. ? Remove the heat if your skin turns bright red. This is especially important if you are unable to feel pain, heat, or cold. You may have a greater risk of getting burned.  Pay close attention to your period (menstrual) cycles. Tell your doctor about any changes, such as: ? A heavier blood flow than usual. ? Needing to use more pads or tampons than normal. ? A change in how many days your period lasts. ? A change in symptoms that come with your period, such as cramps or back pain.  Keep all follow-up visits as told by your doctor. This is important. Your doctor may need to watch your fibroids over time for any changes. Contact a doctor if you:  Have pain that does not get better with medicine or heat, such as pain or cramps in: ? Your back. ? The area between your hip bones (pelvic area). ? Your belly.  Have new bleeding between your periods.  Have more bleeding during or between your periods.  Feel very tired or weak.  Feel light-headed. Get help right away if you:  Pass out (faint).  Have pain in the  area between your hip bones that suddenly gets worse.  Have bleeding that soaks a tampon or pad in 30 minutes or less. Summary  Uterine fibroids are lumps of tissue (tumors) in your womb (uterus). They are not cancer.  The only treatment that most women need is taking aspirin or ibuprofen for pain.  Contact a doctor if you have pain or cramps that do not get better with medicine.  Make sure you know what symptoms you should get help for right away. This information is not intended to replace advice given to you by your health care provider. Make sure you discuss any questions you have with your health care provider. Document Released: 07/11/2010 Document Revised: 05/21/2017 Document Reviewed: 05/04/2017 Elsevier Patient Education  2020 Elsevier Inc.  

## 2019-03-11 LAB — URINE CULTURE

## 2019-03-20 ENCOUNTER — Encounter: Payer: Medicaid Other | Admitting: Family Medicine

## 2019-05-10 ENCOUNTER — Other Ambulatory Visit: Payer: Self-pay | Admitting: Internal Medicine

## 2019-05-10 DIAGNOSIS — I10 Essential (primary) hypertension: Secondary | ICD-10-CM

## 2019-06-06 ENCOUNTER — Other Ambulatory Visit: Payer: Self-pay | Admitting: Internal Medicine

## 2019-06-06 DIAGNOSIS — I10 Essential (primary) hypertension: Secondary | ICD-10-CM

## 2019-06-30 ENCOUNTER — Other Ambulatory Visit: Payer: Self-pay

## 2019-06-30 ENCOUNTER — Ambulatory Visit: Payer: Self-pay | Attending: Internal Medicine | Admitting: Internal Medicine

## 2019-06-30 DIAGNOSIS — I10 Essential (primary) hypertension: Secondary | ICD-10-CM

## 2019-06-30 DIAGNOSIS — E669 Obesity, unspecified: Secondary | ICD-10-CM

## 2019-06-30 DIAGNOSIS — D219 Benign neoplasm of connective and other soft tissue, unspecified: Secondary | ICD-10-CM

## 2019-06-30 MED ORDER — HYDROCHLOROTHIAZIDE 12.5 MG PO TABS: 25.0000 mg | ORAL_TABLET | Freq: Every day | ORAL | 3 refills | Status: DC

## 2019-06-30 NOTE — Progress Notes (Signed)
Virtual Visit via Telephone Note Due to current restrictions/limitations of in-office visits due to the COVID-19 pandemic, this scheduled clinical appointment was converted to a telehealth visit  I connected with Erin Glenn on 06/30/19 at 3:21 p.m by telephone and verified that I am speaking with the correct person using two identifiers. I am in my office.  The patient is at home.  Only the patient and myself participated in this encounter.  I discussed the limitations, risks, security and privacy concerns of performing an evaluation and management service by telephone and the availability of in person appointments. I also discussed with the patient that there may be a patient responsible charge related to this service. The patient expressed understanding and agreed to proceed.   History of Present Illness: Patient with history of HTN, obesity, pre-menopausal, pre-DM, fibroids s/p ablation, vit D def.patient last evaluated 02/2019.  Fibroid: Had a menses last mth after not having one in a year.  Saw new GYN last mth and informed him of this.   Requesting something for "water wgh."  Since last visit, she got down to 196 lbs.  Now 209 lbs.  She feels the excess wgh came from her having a period last mth.  She feels bloated.  She now feels like she was about to have menses again.  Took a "cleaner" thinking it would help get rid of the excess weight but it has not.  Doing [employer].  Bought some wgh [employer].  She noted that some of their foods are high in Na so she stopped purchasing their foods.   -did not exercise last wgh due to holidays but plans to get back into her routine Checks BP once a wk.  Recent readings are 135/90, 127/94       Outpatient Encounter Medications as of 06/30/2019  Medication Sig  . hydrochlorothiazide (HYDRODIURIL) 12.5 MG tablet TAKE 1 TABLET(12.5 MG) BY MOUTH DAILY  . phentermine 30 MG capsule Take 1 capsule (30 mg total) by mouth every morning.  (Patient not taking: Reported on 06/30/2019)   No facility-administered encounter medications on file as of 06/30/2019.    Observations/Objective:   Chemistry      Component Value Date/Time   NA 138 11/29/2018 1100   K 4.3 11/29/2018 1100   CL 104 11/29/2018 1100   CO2 23 11/29/2018 1100   BUN 8 11/29/2018 1100   CREATININE 0.64 11/29/2018 1100   CREATININE 0.65 07/09/2015 2110      Component Value Date/Time   CALCIUM 9.5 11/29/2018 1100   ALKPHOS 84 11/29/2018 1100   AST 14 11/29/2018 1100   ALT 11 11/29/2018 1100   BILITOT 0.3 11/29/2018 1100     Lab Results  Component Value Date   CHOL 166 11/29/2018   HDL 64 11/29/2018   LDLCALC 87 11/29/2018   TRIG 74 11/29/2018   CHOLHDL 2.6 11/29/2018   Lab Results  Component Value Date   WBC 7.0 11/29/2018   HGB 13.3 11/29/2018   HCT 38.1 11/29/2018   MCV 81 11/29/2018   PLT 324 11/29/2018     Assessment and Plan: 1. Essential hypertension Reported blood pressure not at goal which is 130/80 or lower.  Recommend increasing hydrochlorothiazide to 25 mg daily.  Continue low-salt diet. - hydrochlorothiazide (HYDRODIURIL) 12.5 MG tablet; Take 2 tablets (25 mg total) by mouth daily.  Dispense: 30 tablet; Refill: 3  2. Obesity (BMI 35.0-39.9 without comorbidity)  Patient is concerned that she has 9 pounds of water  weight.  However she has no history of congestive heart failure.  Advised patient that use of diuretic as a weight loss medication is not appropriate.  However I reminded her that she is on hydrochlorothiazide which is a diuretic blood pressure medication and we increase the dose today to get blood pressure better.  Patient is thinking about buying some diuretic medication over-the-counter if the hydrochlorothiazide does not get her weight down.  I advised against that. -Encouraged her to continue healthy eating habits and regular exercise   3. Fibroids Sounds as though she may have had an episode of postmenopausal  bleeding.  She did see the gynecologist last month and informed him of this.   Follow Up Instructions: 3-4 mths   I discussed the assessment and treatment plan with the patient. The patient was provided an opportunity to ask questions and all were answered. The patient agreed with the plan and demonstrated an understanding of the instructions.   The patient was advised to call back or seek an in-person evaluation if the symptoms worsen or if the condition fails to improve as anticipated.  I provided 17 minutes of non-face-to-face time during this encounter.   Karle Plumber, MD

## 2019-07-04 ENCOUNTER — Telehealth: Payer: Self-pay | Admitting: Internal Medicine

## 2019-07-04 DIAGNOSIS — E669 Obesity, unspecified: Secondary | ICD-10-CM

## 2019-07-04 NOTE — Telephone Encounter (Signed)
1) Medication(s) Requested (by name): phentermine 30 MG capsule NO:[id number]    2) Pharmacy of Choice: [facility]    3) Special Requests: Patient gained 5 pounds and would like to start medication.   Approved medications will be sent to the pharmacy, we will reach out if there is an issue.  Requests made after 3pm may not be addressed until the following business day!  If a patient is unsure of the name of the medication(s) please note and ask patient to call back when they are able to provide all info, do not send to responsible party until all information is available!

## 2019-07-07 MED ORDER — PHENTERMINE HCL 30 MG PO CAPS: 30.0000 mg | ORAL_CAPSULE | ORAL | 0 refills | Status: DC

## 2019-07-07 NOTE — Telephone Encounter (Signed)
Erin Glenn may you call pt please

## 2019-07-07 NOTE — Telephone Encounter (Signed)
Patient called. Message was given to the patient. No further questions or concerns.

## 2019-07-29 ENCOUNTER — Encounter (HOSPITAL_COMMUNITY): Payer: Self-pay | Admitting: *Deleted

## 2019-07-29 ENCOUNTER — Emergency Department (HOSPITAL_COMMUNITY): Payer: 59

## 2019-07-29 ENCOUNTER — Emergency Department (HOSPITAL_COMMUNITY)
Admission: EM | Admit: 2019-07-29 | Discharge: 2019-07-29 | Disposition: A | Discharge status: Home / Self Care | Payer: 59 | Attending: Emergency Medicine | Admitting: Emergency Medicine

## 2019-07-29 ENCOUNTER — Other Ambulatory Visit: Payer: Self-pay

## 2019-07-29 DIAGNOSIS — M545 Low back pain, unspecified: Secondary | ICD-10-CM

## 2019-07-29 DIAGNOSIS — R319 Hematuria, unspecified: Secondary | ICD-10-CM | POA: Diagnosis not present

## 2019-07-29 DIAGNOSIS — I10 Essential (primary) hypertension: Secondary | ICD-10-CM | POA: Insufficient documentation

## 2019-07-29 LAB — URINALYSIS, ROUTINE W REFLEX MICROSCOPIC
Bacteria, UA: NONE SEEN
Bilirubin Urine: NEGATIVE
Glucose, UA: NEGATIVE mg/dL
Ketones, ur: NEGATIVE mg/dL
Leukocytes,Ua: NEGATIVE
Nitrite: NEGATIVE
Protein, ur: NEGATIVE mg/dL
RBC / HPF: >50 RBC/hpf — ABNORMAL HIGH (ref 0–5)
Specific Gravity, Urine: 1.016 (ref 1.005–1.030)
pH: 7 (ref 5.0–8.0)

## 2019-07-29 LAB — PREGNANCY, URINE: Preg Test, Ur: NEGATIVE

## 2019-07-29 MED ORDER — IBUPROFEN 600 MG PO TABS: 600.0000 mg | ORAL_TABLET | Freq: Four times a day (QID) | ORAL | 0 refills | Status: DC | PRN

## 2019-07-29 MED ORDER — HYDROCODONE-ACETAMINOPHEN 5-325 MG PO TABS: 2.0000 | ORAL_TABLET | ORAL | 0 refills | Status: DC | PRN

## 2019-07-29 MED ORDER — OXYCODONE-ACETAMINOPHEN 5-325 MG PO TABS: 1.0000 | ORAL_TABLET | Freq: Once | ORAL | Status: DC

## 2019-07-29 NOTE — ED Provider Notes (Signed)
[facility] DEPT Provider Note   CSN: [id number] Arrival date & time: 07/29/19  1354     History Chief Complaint  Patient presents with  . Back Pain    Erin Glenn is a 49 y.o. female.  49 year old female presents with lower lumbar sacral pain x24 hours.  Patient states she recently started working out again.  Patient also has noted hematuria and was concerned that she may have a kidney stone.  She has no prior history of kidney stones.  Her pain is not been colicky.  He is also has some dysfunctional uterine bleeding which she relates to her history of fibroids.  Did pass a blood clot this morning and since then she has only had scant blood noted.  Denies any pelvic pain.  No fever or chills.  No nausea or vomiting.  No treatments prior to arrival        Past Medical History:  Diagnosis Date  . Fibroid uterus 2014  . Unspecified vitamin D deficiency 09/26/2013   urgent care at [facility]    Patient Active Problem List   Diagnosis Date Noted  . Dysuria 03/09/2019  . Obesity (BMI 35.0-39.9 without comorbidity) 11/29/2018  . Essential hypertension 07/13/2015  . Fibroid uterus 10/11/2013    Past Surgical History:  Procedure Laterality Date  . DILATION AND CURETTAGE OF UTERUS  2019     OB History    Gravida  0   Para  0   Term  0   Preterm  0   AB  0   Living  0     SAB  0   TAB  0   Ectopic  0   Multiple  0   Live Births  0           Family History  Problem Relation Age of Onset  . Diabetic kidney disease Mother   . Hypertension Mother   . Stroke Mother   . Heart disease Mother   . Hyperlipidemia Mother   . Diabetes Mother   . Alcoholism Father   . Hypertension Father   . Cancer - Other Father 62       throat  . Diabetes Father     Social History   Tobacco Use  . Smoking status: Never Smoker  . Smokeless tobacco: Never Used  Substance Use Topics  . Alcohol use: No  . Drug use: No    Home  Medications Prior to Admission medications   Medication Sig Start Date End Date Taking? Authorizing Provider  hydrochlorothiazide (HYDRODIURIL) 12.5 MG tablet Take 2 tablets (25 mg total) by mouth daily. 06/30/19   Ladell Pier, MD  phentermine 30 MG capsule Take 1 capsule (30 mg total) by mouth every morning. 07/07/19   Ladell Pier, MD    Allergies    Hydrocodone-acetaminophen  Review of Systems   Review of Systems  All other systems reviewed and are negative.   Physical Exam Updated Vital Signs BP (!) 154/109 (BP Location: Right Arm)   Pulse 73   Temp (!) 97.4 F (36.3 C) (Oral)   Ht 1.626 m (5\' 4" )   Wt 98.9 kg   SpO2 100%   BMI 37.42 kg/m   Physical Exam Vitals and nursing note reviewed.  Constitutional:      General: She is not in acute distress.    Appearance: Normal appearance. She is well-developed. She is not toxic-appearing.  HENT:     Head: Normocephalic and atraumatic.  Eyes:     General: Lids are normal.     Conjunctiva/sclera: Conjunctivae normal.     Pupils: Pupils are equal, round, and reactive to light.  Neck:     Thyroid: No thyroid mass.     Trachea: No tracheal deviation.  Cardiovascular:     Rate and Rhythm: Normal rate and regular rhythm.     Heart sounds: Normal heart sounds. No murmur. No gallop.   Pulmonary:     Effort: Pulmonary effort is normal. No respiratory distress.     Breath sounds: Normal breath sounds. No stridor. No decreased breath sounds, wheezing, rhonchi or rales.  Abdominal:     General: Bowel sounds are normal. There is no distension.     Palpations: Abdomen is soft.     Tenderness: There is no abdominal tenderness. There is no rebound.  Musculoskeletal:        General: Normal range of motion.     Cervical back: Normal range of motion and neck supple.     Lumbar back: Tenderness present.       Back:  Skin:    General: Skin is warm and dry.     Findings: No abrasion or rash.  Neurological:     Mental  Status: She is alert and oriented to person, place, and time.     GCS: GCS eye subscore is 4. GCS verbal subscore is 5. GCS motor subscore is 6.     Cranial Nerves: No cranial nerve deficit.     Sensory: No sensory deficit.  Psychiatric:        Speech: Speech normal.        Behavior: Behavior normal.     ED Results / Procedures / Treatments   Labs (all labs ordered are listed, but only abnormal results are displayed) Labs Reviewed  URINE CULTURE  URINALYSIS, ROUTINE W REFLEX MICROSCOPIC  PREGNANCY, URINE    EKG None  Radiology No results found.  Procedures Procedures (including critical care time)  Medications Ordered in ED Medications - No data to display  ED Course  I have reviewed the triage vital signs and the nursing notes.  Pertinent labs & imaging results that were available during my care of the patient were reviewed by me and considered in my medical decision making (see chart for details).    MDM Rules/Calculators/A&P                      Patient offered pain medication and would like something to go.  Patient is urinalysis negative for infection but did show hematuria.  Patient had renal CT ordered and she has decided to not wait for the test and will follow up with her PCP. Final Clinical Impression(s) / ED Diagnoses Final diagnoses:  None    Rx / DC Orders ED Discharge Orders    None       Lacretia Leigh, MD 07/29/19 [id number]

## 2019-07-29 NOTE — ED Triage Notes (Signed)
Onset of pain lower spinal area, non radiating, denies difficulty urinating or have a BM. Has a fibroid tumor, States when she urinated this morning she saw blood and clots in toilet. Concerned she may have a kidney stone.

## 2019-07-31 LAB — URINE CULTURE: Culture: 30000 — AB

## 2019-08-07 ENCOUNTER — Other Ambulatory Visit: Payer: Self-pay | Admitting: Internal Medicine

## 2019-08-07 DIAGNOSIS — E669 Obesity, unspecified: Secondary | ICD-10-CM

## 2019-08-09 NOTE — Telephone Encounter (Signed)
Contacted pt and went over Dr. Wynetta Emery response. Pt states she work Monday-Friday 8-5. I informed pt that if she needs to call back to schedule she can do that. I offered pt an appointment for march 1 at 910 pt is unable to do appointment

## 2019-08-25 ENCOUNTER — Ambulatory Visit
Admission: RE | Admit: 2019-08-25 | Discharge: 2019-08-25 | Disposition: A | Discharge status: Home / Self Care | Payer: 59 | Source: Ambulatory Visit | Attending: Internal Medicine | Admitting: Internal Medicine

## 2019-08-25 ENCOUNTER — Other Ambulatory Visit: Payer: Self-pay

## 2019-08-25 DIAGNOSIS — Z1239 Encounter for other screening for malignant neoplasm of breast: Secondary | ICD-10-CM

## 2019-08-29 ENCOUNTER — Other Ambulatory Visit: Payer: Self-pay | Admitting: Internal Medicine

## 2019-08-29 DIAGNOSIS — R928 Other abnormal and inconclusive findings on diagnostic imaging of breast: Secondary | ICD-10-CM

## 2019-09-12 ENCOUNTER — Other Ambulatory Visit: Payer: Self-pay

## 2019-09-12 ENCOUNTER — Ambulatory Visit
Admission: RE | Admit: 2019-09-12 | Discharge: 2019-09-12 | Disposition: A | Discharge status: Home / Self Care | Payer: 59 | Source: Ambulatory Visit | Attending: Internal Medicine | Admitting: Internal Medicine

## 2019-09-12 ENCOUNTER — Other Ambulatory Visit: Payer: Self-pay | Admitting: Internal Medicine

## 2019-09-12 DIAGNOSIS — R928 Other abnormal and inconclusive findings on diagnostic imaging of breast: Secondary | ICD-10-CM

## 2019-09-12 DIAGNOSIS — N6320 Unspecified lump in the left breast, unspecified quadrant: Secondary | ICD-10-CM

## 2019-09-21 HISTORY — PX: BREAST BIOPSY: SHX20

## 2019-09-22 ENCOUNTER — Ambulatory Visit
Admission: RE | Admit: 2019-09-22 | Discharge: 2019-09-22 | Disposition: A | Discharge status: Home / Self Care | Payer: 59 | Source: Ambulatory Visit | Attending: Internal Medicine | Admitting: Internal Medicine

## 2019-09-22 ENCOUNTER — Other Ambulatory Visit: Payer: Self-pay

## 2019-09-22 ENCOUNTER — Other Ambulatory Visit: Payer: Self-pay | Admitting: Internal Medicine

## 2019-09-22 DIAGNOSIS — N6320 Unspecified lump in the left breast, unspecified quadrant: Secondary | ICD-10-CM

## 2019-10-03 ENCOUNTER — Other Ambulatory Visit: Payer: Self-pay | Admitting: Internal Medicine

## 2019-10-03 DIAGNOSIS — I10 Essential (primary) hypertension: Secondary | ICD-10-CM

## 2019-10-04 ENCOUNTER — Telehealth: Payer: Self-pay | Admitting: Internal Medicine

## 2019-10-04 NOTE — Telephone Encounter (Signed)
PC placed to pt today to review the pathology results of LT breast bx and radiologist recommendation.  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  Radiologist recommended repeat bx for flow cytometric studies or surgical consultation for consideration of excisional bx.  Pt told them she will get second opinion. I wanted to know if she wants me to refer her for a second opinion. She tells me today that she plans to get copy of her records and go back to her previous breast doctor in [place] for a second opinion. She tells me she does not trust the place where she had the bx done and feels "things aren't adding up."  I recommend that she not wait too long to get the second opinion.

## 2019-10-06 ENCOUNTER — Other Ambulatory Visit: Payer: Self-pay

## 2019-10-06 ENCOUNTER — Ambulatory Visit: Payer: 59 | Attending: Internal Medicine | Admitting: Internal Medicine

## 2019-10-06 ENCOUNTER — Encounter: Payer: Self-pay | Admitting: Internal Medicine

## 2019-10-06 VITALS — BP 143/105 | HR 78 | Temp 97.7°F | Resp 16 | Wt 221.6 lb

## 2019-10-06 DIAGNOSIS — E669 Obesity, unspecified: Secondary | ICD-10-CM | POA: Diagnosis not present

## 2019-10-06 DIAGNOSIS — N950 Postmenopausal bleeding: Secondary | ICD-10-CM

## 2019-10-06 DIAGNOSIS — R928 Other abnormal and inconclusive findings on diagnostic imaging of breast: Secondary | ICD-10-CM

## 2019-10-06 DIAGNOSIS — I10 Essential (primary) hypertension: Secondary | ICD-10-CM | POA: Diagnosis not present

## 2019-10-06 DIAGNOSIS — M545 Low back pain, unspecified: Secondary | ICD-10-CM

## 2019-10-06 DIAGNOSIS — Z0000 Encounter for general adult medical examination without abnormal findings: Secondary | ICD-10-CM

## 2019-10-06 MED ORDER — MELOXICAM 15 MG PO TABS: 15.0000 mg | ORAL_TABLET | Freq: Every day | ORAL | 0 refills | Status: DC
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
## 2019-10-06 NOTE — Patient Instructions (Addendum)
Your blood pressure is not at goal.  The goal is 130/80 or lower.  He should be taking hydrochlorothiazide 12.5 mg TWO tablets daily.  Continue regular exercise and healthy eating habits.   I have sent the prescription for meloxicam which is an anti-inflammatory medication to your pharmacy.

## 2019-10-07 LAB — CBC
Hematocrit: 40.1 % (ref 34.0–46.6)
Hemoglobin: 13.6 g/dL (ref 11.1–15.9)
MCH: 27.4 pg (ref 26.6–33.0)
MCHC: 33.9 g/dL (ref 31.5–35.7)
MCV: 81 fL (ref 79–97)
Platelets: 362 10*3/uL (ref 150–450)
RBC: 4.96 x10E6/uL (ref 3.77–5.28)
RDW: 14.2 % (ref 11.7–15.4)
WBC: 7.9 10*3/uL (ref 3.4–10.8)

## 2019-10-07 LAB — COMPREHENSIVE METABOLIC PANEL
ALT: 13 IU/L (ref 0–32)
AST: 19 IU/L (ref 0–40)
Albumin/Globulin Ratio: 1.3 (ref 1.2–2.2)
Albumin: 4.5 g/dL (ref 3.8–4.8)
Alkaline Phosphatase: 84 IU/L (ref 39–117)
BUN/Creatinine Ratio: 12 (ref 9–23)
BUN: 8 mg/dL (ref 6–24)
Bilirubin Total: 0.3 mg/dL (ref 0.0–1.2)
CO2: 24 mmol/L (ref 20–29)
Calcium: 9.6 mg/dL (ref 8.7–10.2)
Chloride: 102 mmol/L (ref 96–106)
Creatinine, Ser: 0.65 mg/dL (ref 0.57–1.00)
GFR calc Af Amer: 123 mL/min/{1.73_m2} (ref >59)
GFR calc non Af Amer: 107 mL/min/{1.73_m2} (ref >59)
Globulin, Total: 3.4 g/dL (ref 1.5–4.5)
Glucose: 86 mg/dL (ref 65–99)
Potassium: 4.4 mmol/L (ref 3.5–5.2)
Sodium: 140 mmol/L (ref 134–144)
Total Protein: 7.9 g/dL (ref 6.0–8.5)

## 2019-10-07 LAB — URINALYSIS
Bilirubin, UA: NEGATIVE
Glucose, UA: NEGATIVE
Ketones, UA: NEGATIVE
Leukocytes,UA: NEGATIVE
Nitrite, UA: NEGATIVE
Protein,UA: NEGATIVE
RBC, UA: NEGATIVE
Specific Gravity, UA: 1.014 (ref 1.005–1.030)
Urobilinogen, Ur: 0.2 mg/dL (ref 0.2–1.0)
pH, UA: 7.5 (ref 5.0–7.5)

## 2019-10-07 LAB — LIPID PANEL
Chol/HDL Ratio: 2.2 ratio (ref 0.0–4.4)
Cholesterol, Total: 175 mg/dL (ref 100–199)
HDL: 78 mg/dL (ref >39)
LDL Chol Calc (NIH): 87 mg/dL (ref 0–99)
Triglycerides: 52 mg/dL (ref 0–149)
VLDL Cholesterol Cal: 10 mg/dL (ref 5–40)

## 2019-10-07 LAB — HEMOGLOBIN A1C
Est. average glucose Bld gHb Est-mCnc: 111 mg/dL
Hgb A1c MFr Bld: 5.5 % (ref 4.8–5.6)

## 2019-10-07 LAB — VITAMIN D 25 HYDROXY (VIT D DEFICIENCY, FRACTURES): Vit D, 25-Hydroxy: 26.5 ng/mL — ABNORMAL LOW (ref 30.0–100.0)

## 2019-10-31 ENCOUNTER — Other Ambulatory Visit: Payer: Self-pay | Admitting: Internal Medicine

## 2019-10-31 DIAGNOSIS — I10 Essential (primary) hypertension: Secondary | ICD-10-CM

## 2019-11-17 ENCOUNTER — Telehealth: Payer: Self-pay | Admitting: Internal Medicine

## 2019-11-17 NOTE — Telephone Encounter (Signed)
Pt was seen at breast center for Left breast mass , with swelling of the lymph nodes under axilla wants to know if ok to take COVID vaccine at this time. Please advise

## 2019-11-22 NOTE — Telephone Encounter (Signed)
Contacted pt to go over Dr. Johnson response pt is aware and doesn't have any questions or concerns  

## 2019-11-24 ENCOUNTER — Other Ambulatory Visit: Payer: Self-pay | Admitting: Surgery

## 2019-11-28 ENCOUNTER — Other Ambulatory Visit: Payer: Self-pay | Admitting: Internal Medicine

## 2019-11-28 DIAGNOSIS — R928 Other abnormal and inconclusive findings on diagnostic imaging of breast: Secondary | ICD-10-CM

## 2019-11-28 NOTE — Telephone Encounter (Signed)
Pt needs to speak about breast exam results

## 2019-11-29 NOTE — Telephone Encounter (Signed)
Can you please follow up with this patient regarding her concerns? It looks like you had relayed some information about her breast from Dr Wynetta Emery to her previously. Thanks.

## 2019-11-29 NOTE — Telephone Encounter (Signed)
Returned pt call and made aware that Dr. Wynetta Emery has put in orders and they are for [facility]

## 2019-12-08 ENCOUNTER — Ambulatory Visit: Payer: 59

## 2019-12-12 ENCOUNTER — Telehealth: Payer: Self-pay | Admitting: Internal Medicine

## 2019-12-12 NOTE — Telephone Encounter (Signed)
Will forward to pcp

## 2019-12-12 NOTE — Telephone Encounter (Signed)
Patient called saying she used to take phentermine 30 MG capsule For weight loss. Patient is requesting a refill and would like to know if she can just come in and do a BP check in order for her to get her refill. Please f/u

## 2019-12-13 NOTE — Telephone Encounter (Signed)
Contacted pt to go over Dr. Wynetta Emery message pt. I made pt aware that she already has any appt schedule with pcp on 7/23 and she will need to keep that appointment because we don't have anything sooner. Pt states she will go to another doctor to get the phentermine. Pt states she is not going to wait a whole month for her medication. Made pt aware that I can put her on a cancellation list pt states no she will go find another doctor and will see pcp on the 23rd of July

## 2019-12-13 NOTE — Telephone Encounter (Signed)
Will forward to pcp

## 2019-12-14 ENCOUNTER — Ambulatory Visit
Admission: RE | Admit: 2019-12-14 | Discharge: 2019-12-14 | Disposition: A | Discharge status: Home / Self Care | Payer: 59 | Source: Ambulatory Visit | Attending: Internal Medicine | Admitting: Internal Medicine

## 2019-12-14 DIAGNOSIS — R928 Other abnormal and inconclusive findings on diagnostic imaging of breast: Secondary | ICD-10-CM

## 2019-12-14 HISTORY — PX: BREAST BIOPSY: SHX20

## 2019-12-18 LAB — SURGICAL PATHOLOGY

## 2020-01-12 ENCOUNTER — Ambulatory Visit: Payer: 59 | Attending: Internal Medicine | Admitting: Internal Medicine

## 2020-01-12 ENCOUNTER — Other Ambulatory Visit: Payer: Self-pay

## 2020-01-12 DIAGNOSIS — I10 Essential (primary) hypertension: Secondary | ICD-10-CM

## 2020-01-12 DIAGNOSIS — E669 Obesity, unspecified: Secondary | ICD-10-CM | POA: Diagnosis not present

## 2020-01-12 MED ORDER — PHENTERMINE HCL 30 MG PO CAPS: 30.0000 mg | ORAL_CAPSULE | ORAL | 1 refills | Status: DC

## 2020-01-12 NOTE — Progress Notes (Signed)
Pt is requesting a refill on Phentermine

## 2020-01-12 NOTE — Progress Notes (Signed)
Virtual Visit via Telephone Note Due to current restrictions/limitations of in-office visits due to the COVID-19 pandemic, this scheduled clinical appointment was converted to a telehealth visit  I connected with Erin Glenn on 01/12/20 at 12:19 p.m by telephone and verified that I am speaking with the correct person using two identifiers. I am in my office.  The patient is at home.  Only the patient and myself participated in this encounter.  I discussed the limitations, risks, security and privacy concerns of performing an evaluation and management service by telephone and the availability of in person appointments. I also discussed with the patient that there may be a patient responsible charge related to this service. The patient expressed understanding and agreed to proceed.   History of Present Illness: Patient with history of HTN, obesity, pre-menopausal, pre-DM, fibroids s/p ablation, vit D def.  Last eval 09/2019.   Since last visit she had repeat bx of LT breast.  Pathology revealed benign lymph node tissue.  Obesity:  On last visit, pt said she was waiting to get things complete with uterine bx before starting work out routine.  Doing intermittent fasting.  She also started going back to gym 2 wks ago with a group.  Does cardio 3 days a wk and wgh training 2 days a wk. She would like to get back on Phentermine to help decrease appetite. Wgh 2 wks ago was 220 lbs.  Goal is 150 lbs.    HTN: last BP 128/78 this a.m.  Reports BP have been good.  Compliant with HCTZ.  Limits salt in foods.  HM:  Did get Moderna vaccine 1st shot and will get 2nd shot today.  Current Outpatient Medications  Medication Instructions  . hydrochlorothiazide (HYDRODIURIL) 12.5 MG tablet TAKE 2 TABLETS(25 MG) BY MOUTH DAILY  . meloxicam (MOBIC) 15 MG tablet TAKE 1 TABLET(15 MG) BY MOUTH DAILY  . phentermine 30 mg, Oral, BH-each morning    Observations/Objective: No direct observation done as this was a  telephone encounter.  Assessment and Plan: 1. Obesity (BMI 35.0-39.9 without comorbidity) -Commended her on wanting to work on getting her weight down and living healthy lifestyle.  She will continue her current exercise routine. Dietary counseling given.  Advised to eliminate sugary drinks from the diet, cut back on white carbohydrates and incorporate fresh fruits and vegetables into the diet. I will refill the phentermine for 2 months and then have her follow-up with me at that time to see how she is doing. - phentermine 30 MG capsule; Take 1 capsule (30 mg total) by mouth every morning.  Dispense: 30 capsule; Refill: 1  2. Essential hypertension Reported blood pressure at goal.  Continue hydrochlorothiazide   Follow Up Instructions: 2 mth   I discussed the assessment and treatment plan with the patient. The patient was provided an opportunity to ask questions and all were answered. The patient agreed with the plan and demonstrated an understanding of the instructions.   The patient was advised to call back or seek an in-person evaluation if the symptoms worsen or if the condition fails to improve as anticipated.  I provided 12 minutes of non-face-to-face time during this encounter.   Karle Plumber, MD

## 2020-01-26 ENCOUNTER — Telehealth: Payer: Self-pay | Admitting: Internal Medicine

## 2020-01-26 NOTE — Telephone Encounter (Signed)
Returned pt call and made pt aware that she will need to wait till Monday for rx. Pt states no the reason why she called was because medication was sent to the wrong pharmacy and she wanted it transferred to [facility] on [address]. Contacted [facility] on [address] and spoke to [place]. Pharmacist and per Coralyn Mark wouldn't be able to transfer provider will need to send new rx to correct pharmacy. Returned pt call and made her aware. Pt states that the [facility] on [address] wouldn't fill rx. I made pt aware that they did have rx ready. Made pt aware that if she wants rx she will need to contact pharmacy

## 2020-01-26 NOTE — Telephone Encounter (Signed)
Will forward to covering provider to see if she is able to send rx if not pt will need to wait till Monday till Dr. Wynetta Emery is back in the office

## 2020-01-26 NOTE — Telephone Encounter (Signed)
Will need to wait for PCP.

## 2020-01-26 NOTE — Telephone Encounter (Signed)
Copied from [place] [id number]. Topic: General - Other >> Jan 26, 2020  8:43 AM Leward Quan A wrote: Reason for CRM: Patient called tyo ask Dr Wynetta Emery to please send Rx for phentermine 30 MG capsule to the [facility], [place]  Phone:  [phone] Fax:  [phone] She would like to get this medication today if possible please.

## 2020-02-13 ENCOUNTER — Other Ambulatory Visit: Payer: Self-pay | Admitting: Pharmacist

## 2020-02-13 DIAGNOSIS — I10 Essential (primary) hypertension: Secondary | ICD-10-CM

## 2020-02-13 MED ORDER — HYDROCHLOROTHIAZIDE 12.5 MG PO TABS: ORAL_TABLET | ORAL | 1 refills | Status: DC

## 2020-03-02 ENCOUNTER — Other Ambulatory Visit: Payer: Self-pay | Admitting: Internal Medicine

## 2020-03-02 NOTE — Telephone Encounter (Signed)
Requested Prescriptions  Pending Prescriptions Disp Refills   meloxicam (MOBIC) 15 MG tablet [Pharmacy Med Name: MELOXICAM 15MG  TABLETS] 90 tablet 0    Sig: TAKE 1 TABLET(15 MG) BY MOUTH DAILY     Analgesics:  COX2 Inhibitors Passed - 03/02/2020  6:37 PM      Passed - HGB in normal range and within 360 days    Hemoglobin  Date Value Ref Range Status  10/06/2019 13.6 11.1 - 15.9 g/dL Final         Passed - Cr in normal range and within 360 days    Creat  Date Value Ref Range Status  07/09/2015 0.65 0.50 - 1.10 mg/dL Final   Creatinine, Ser  Date Value Ref Range Status  10/06/2019 0.65 0.57 - 1.00 mg/dL Final   Creatinine, POC  Date Value Ref Range Status  12/17/2016 200 mg/dL Final         Passed - Patient is not pregnant      Passed - Valid encounter within last 12 months    Recent Outpatient Visits          1 month ago Obesity (BMI 35.0-39.9 without comorbidity)   [place] Ladell Pier, MD   4 months ago Annual physical exam   [place] Karle Plumber B, MD   8 months ago Obesity (BMI 35.0-39.9 without comorbidity)   [place] Ladell Pier, MD   1 year ago Obesity (BMI 35.0-39.9 without comorbidity)   [place] Ladell Pier, MD   1 year ago Obesity (BMI 35.0-39.9 without comorbidity)   [facility] Ladell Pier, MD

## 2020-03-04 ENCOUNTER — Telehealth: Payer: Self-pay | Admitting: Internal Medicine

## 2020-03-04 NOTE — Telephone Encounter (Signed)
Medication: furosemide (LASIX) 20 MG tablet [[id number]]  DISCONTINUED -Patient declined appt  Has the patient contacted their pharmacy? Yes  (Agent: If no, request that the patient contact the pharmacy for the refill.) (Agent: If yes, when and what did the pharmacy advise?)  Preferred Pharmacy (with phone number or street name): [facility] [place], [address] [place] [address] Phone: [phone] Fax: [phone] Hours: Open 24 hours    Agent: Please be advised that RX refills may take up to 3 business days. We ask that you follow-up with your pharmacy.

## 2020-03-04 NOTE — Telephone Encounter (Signed)
Patient reports swelling around her ankles and is requesting a refill for lasix. It was noted that the Lasix was disconnected. Patient was offered an appt with Dr. Joya Gaskins on 04/03/20. Patient declined appt. And stated that she will go to ER for ankle swelling. Advised the patient that she may want to go to UC rather than UC. Please advise CB- [phone]

## 2020-03-04 NOTE — Telephone Encounter (Signed)
Not on current med profile

## 2020-03-05 NOTE — Telephone Encounter (Signed)
Will forward to pcp

## 2020-03-05 NOTE — Telephone Encounter (Signed)
Pt states she she has been wearing compressions stockings. Pt states she has changed her eating habits. Pt states is working out. Pt states she didn't go to the urgent care. Pt states she is taking a otc directic. Made pt aware that she is on hctz. Pt states she is trying to get off the hctz. Pt states she is not taking high bp pills.

## 2020-03-05 NOTE — Telephone Encounter (Signed)
Does dr Wynetta Emery or another provider have availablity or she could go to mobile clinic today, I have not seen her before

## 2020-03-06 NOTE — Telephone Encounter (Signed)
Please contact pt and schedule an appt per Dr. Wynetta Emery

## 2020-03-07 ENCOUNTER — Other Ambulatory Visit: Payer: Self-pay | Admitting: Internal Medicine

## 2020-03-07 DIAGNOSIS — E669 Obesity, unspecified: Secondary | ICD-10-CM

## 2020-03-07 MED ORDER — PHENTERMINE HCL 30 MG PO CAPS: 30.0000 mg | ORAL_CAPSULE | ORAL | 0 refills | Status: DC

## 2020-03-07 NOTE — Telephone Encounter (Signed)
Copied from [place] [id number]. Topic: Quick Communication - Rx Refill/Question >> Mar 07, 2020 12:34 PM Mcneil, Jacinto Reap wrote: Pt stated that the [facility] on [address] deleted her account so now she is unable to get the refill of her medication due to it being a controlled substance. Pt requests that the refill request be sent to [id number] - [address] AT [place] Medication: phentermine 30 MG capsule  Has the patient contacted their pharmacy? yes   Preferred Pharmacy (with phone number or street name): [id number] - [place], [place] Phone: [phone]  Fax: [phone]  Agent: Please be advised that RX refills may take up to 3 business days. We ask that you follow-up with your pharmacy.

## 2020-03-07 NOTE — Telephone Encounter (Signed)
° °  Notes to clinic: Pt stated that the [facility] on [address] deleted her account so now she is unable to get the refill of her medication due to it being a controlled substance   Requested Prescriptions  Pending Prescriptions Disp Refills   phentermine 30 MG capsule 30 capsule 1    Sig: Take 1 capsule (30 mg total) by mouth every morning.      Not Delegated - Gastroenterology:  Antiobesity Agents Failed - 03/07/2020 12:52 PM      Failed - This refill cannot be delegated      Failed - Last BP in normal range    BP Readings from Last 1 Encounters:  10/06/19 (!) 143/105          Passed - Last Heart Rate in normal range    Pulse Readings from Last 1 Encounters:  10/06/19 78          Passed - Valid encounter within last 12 months    Recent Outpatient Visits           1 month ago Obesity (BMI 35.0-39.9 without comorbidity)   [place] Ladell Pier, MD   5 months ago Annual physical exam   [place] Ladell Pier, MD   8 months ago Obesity (BMI 35.0-39.9 without comorbidity)   [place] Ladell Pier, MD   1 year ago Obesity (BMI 35.0-39.9 without comorbidity)   [place] Ladell Pier, MD   1 year ago Obesity (BMI 35.0-39.9 without comorbidity)   [facility] Ladell Pier, MD

## 2020-03-11 ENCOUNTER — Telehealth: Payer: Self-pay | Admitting: Internal Medicine

## 2020-03-11 DIAGNOSIS — E669 Obesity, unspecified: Secondary | ICD-10-CM

## 2020-03-11 MED ORDER — PHENTERMINE HCL 30 MG PO CAPS: 30.0000 mg | ORAL_CAPSULE | ORAL | 0 refills | Status: DC

## 2020-03-11 NOTE — Telephone Encounter (Signed)
[facility] did not received phentermine rx  per escribe it was phone in. Please resend

## 2020-03-11 NOTE — Telephone Encounter (Signed)
[facility] [address], [place], [place] [address] Phone: [phone] calling back checking on the status of  PHENTERMINE 30 MG request and would like request expedited

## 2020-03-11 NOTE — Telephone Encounter (Signed)
Will forward to pcp

## 2020-03-25 ENCOUNTER — Encounter: Payer: Self-pay | Admitting: Internal Medicine

## 2020-03-25 ENCOUNTER — Ambulatory Visit: Payer: 59 | Attending: Internal Medicine | Admitting: Internal Medicine

## 2020-03-25 ENCOUNTER — Other Ambulatory Visit: Payer: Self-pay

## 2020-03-25 DIAGNOSIS — Z2821 Immunization not carried out because of patient refusal: Secondary | ICD-10-CM

## 2020-03-25 DIAGNOSIS — I10 Essential (primary) hypertension: Secondary | ICD-10-CM | POA: Diagnosis not present

## 2020-03-25 DIAGNOSIS — E669 Obesity, unspecified: Secondary | ICD-10-CM | POA: Diagnosis not present

## 2020-03-25 NOTE — Progress Notes (Signed)
Virtual Visit via Telephone Note Due to current restrictions/limitations of in-office visits due to the COVID-19 pandemic, this scheduled clinical appointment was converted to a telehealth visit  I connected with Erin Glenn on 03/25/20 at 11:29 a.m by telephone and verified that I am speaking with the correct person using two identifiers. I am in my office.  The patient is at home.  Only the patient, CMA Maryjean Ka and myself participated in this encounter.  I discussed the limitations, risks, security and privacy concerns of performing an evaluation and management service by telephone and the availability of in person appointments. I also discussed with the patient that there may be a patient responsible charge related to this service. The patient expressed understanding and agreed to proceed.   History of Present Illness: Patient with history of HTN, obesity, pre-menopausal, pre-DM, fibroids s/p ablation, vit D def.  last visit with me was 12/2019.  Today's visit was follow-up on obesity and concern about medication.  Patient had called last month requesting to be placed on furosemide stating that she is not taking hydrochlorothiazide because she does not have blood pressure issue.   -Today she tells me that she does not have blood pressure issue and that she has read that the hydrochlorothiazide is not good for the kidneys.  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  She started taking an herbal supplement called Ultra Life Advanced Blood Pressure Support and reports that her blood pressure has been good.  Blood pressure this morning was 118/83.  In regards to her weight, she went back to weight watchers and goes about once a week.  She still does the intermittent fasting.  I had prescribed phentermine for her with a refill on last visit.  However she states she does not take it every day.  She takes it about 3 times a week only when she feels she is tempted to have a "stress  moment." -In August her weight was up to 226 pounds but now she is at 213 pounds.  She tolerates the phentermine when she takes it. -She is exercising Monday through Friday doing some cardio and light weight training. -She tells me that when she had called for furosemide she was having some problem with her knee and wanted the furosemide for that but since then the problem has resolved because she started taking an herbal supplement called Kidney Cleanse Detox Support that contains cranberry and various vitamins.  The swelling and pain in her knee has resolved.  HM: She declines the flu vaccine.  She has completed the COVID-19 Moderna vaccine.  Outpatient Encounter Medications as of 03/25/2020  Medication Sig  . meloxicam (MOBIC) 15 MG tablet TAKE 1 TABLET(15 MG) BY MOUTH DAILY  . phentermine 30 MG capsule Take 1 capsule (30 mg total) by mouth every morning.  . hydrochlorothiazide (HYDRODIURIL) 12.5 MG tablet TAKE 2 TABLETS(25 MG) BY MOUTH DAILY (Patient not taking: Reported on 03/25/2020)   No facility-administered encounter medications on file as of 03/25/2020.      Observations/Objective: Lab Results  Component Value Date   WBC 7.9 10/06/2019   HGB 13.6 10/06/2019   HCT 40.1 10/06/2019   MCV 81 10/06/2019   PLT 362 10/06/2019     Chemistry      Component Value Date/Time   NA 140 10/06/2019 1129   K 4.4 10/06/2019 1129   CL 102 10/06/2019 1129   CO2 24 10/06/2019 1129   BUN 8  10/06/2019 1129   CREATININE 0.65 10/06/2019 1129   CREATININE 0.65 07/09/2015 2110      Component Value Date/Time   CALCIUM 9.6 10/06/2019 1129   ALKPHOS 84 10/06/2019 1129   AST 19 10/06/2019 1129   ALT 13 10/06/2019 1129   BILITOT 0.3 10/06/2019 1129       Assessment and Plan: 1. Obesity (BMI 35.0-39.9 without comorbidity) -Commended her on weight loss. Encouraged her to continue moderate intensity exercise at least 4 days a week for 30 to 45 minutes. -Continue to encourage healthy eating  habits. I think weight watchers is a wonderful program if she is able to stick with it. -I have tried to dispel some of the false notions of using diuretics for weight loss and various herbal cleanse -I will continue to prescribe the phentermine as long as she is losing weight. I would like to see her in follow-up in 2 months to see how she is doing.  2. Essential hypertension -Advised patient that based on previous visits and blood pressure readings including some of her own readings that she had told me in the past, she does have essential hypertension. -Even though reported blood pressure reading that she told me today is acceptable, I reminded her that the goal is to get and keep the blood pressure 130/80 or lower. She has stopped hydrochlorothiazide which I told her is fine as long as her blood pressure is staying at or below goal. -In regards to the herbal supplements that she is taking, I told her to be careful with herbal supplements as they sometimes can have adverse effect on body systems including kidneys and liver. -In regards to her concern about hydrochlorothiazide, I told her that hydrochlorothiazide can sometimes adversely affect the kidneys depending on various circumstances including other medicines that may be prescribed, whether the person becomes dehydrated etc. We do keep a check on kidney function and electrolytes when patients are on hydrochlorothiazide.  3. Influenza vaccination declined This was offered. Patient declined.   Follow Up Instructions: 2 mths   I discussed the assessment and treatment plan with the patient. The patient was provided an opportunity to ask questions and all were answered. The patient agreed with the plan and demonstrated an understanding of the instructions.   The patient was advised to call back or seek an in-person evaluation if the symptoms worsen or if the condition fails to improve as anticipated.  I provided 24 minutes of non-face-to-face  time during this encounter.   Karle Plumber, MD

## 2020-04-15 ENCOUNTER — Other Ambulatory Visit: Payer: Self-pay | Admitting: Internal Medicine

## 2020-04-15 ENCOUNTER — Telehealth: Payer: Self-pay | Admitting: Internal Medicine

## 2020-04-15 DIAGNOSIS — E669 Obesity, unspecified: Secondary | ICD-10-CM

## 2020-04-15 MED ORDER — PHENTERMINE HCL 30 MG PO CAPS: 30.0000 mg | ORAL_CAPSULE | ORAL | 0 refills | Status: DC

## 2020-04-15 NOTE — Telephone Encounter (Signed)
Pt called about her refill for phentermine 30 MG capsule  /Pt stated she was suppose to have a 2 month supply sent to pharmacy after her 10.4.21 visit/ please advise

## 2020-04-15 NOTE — Telephone Encounter (Signed)
Patient called and she wants to know why she can't get the prescription. She has been to the pharmacy and they keep telling her they don't have it. She wants her prescription called in. Please advise

## 2020-04-16 NOTE — Telephone Encounter (Signed)
Called pt made aware °

## 2020-04-16 NOTE — Telephone Encounter (Signed)
Refusing request, refilled by provider on 04/15/20.

## 2020-05-15 ENCOUNTER — Other Ambulatory Visit: Payer: Self-pay | Admitting: Internal Medicine

## 2020-05-15 DIAGNOSIS — E669 Obesity, unspecified: Secondary | ICD-10-CM

## 2020-05-15 NOTE — Telephone Encounter (Signed)
Requested medication (s) are due for refill today: Yes  Requested medication (s) are on the active medication list: Yes  Last refill:  04/15/20  Future visit scheduled: Yes  Notes to clinic:  See request.    Requested Prescriptions  Pending Prescriptions Disp Refills   phentermine 30 MG capsule [Pharmacy Med Name: PHENTERMINE HCL 30MG  CAPSULES] 30 capsule     Sig: TAKE 1 CAPSULE(30 MG) BY MOUTH EVERY MORNING      Not Delegated - Gastroenterology:  Antiobesity Agents Failed - 05/15/2020  2:40 PM      Failed - This refill cannot be delegated      Failed - Last BP in normal range    BP Readings from Last 1 Encounters:  10/06/19 (!) 143/105          Passed - Last Heart Rate in normal range    Pulse Readings from Last 1 Encounters:  10/06/19 78          Passed - Valid encounter within last 12 months    Recent Outpatient Visits           1 month ago Obesity (BMI 35.0-39.9 without comorbidity)   [place], Deborah B, MD   4 months ago Obesity (BMI 35.0-39.9 without comorbidity)   [place], Deborah B, MD   7 months ago Annual physical exam   [place] Ladell Pier, MD   10 months ago Obesity (BMI 35.0-39.9 without comorbidity)   [place], Deborah B, MD   1 year ago Obesity (BMI 35.0-39.9 without comorbidity)   [place], Deborah B, MD       Future Appointments             In 1 week Ladell Pier, MD [place]

## 2020-05-28 ENCOUNTER — Ambulatory Visit: Payer: 59 | Admitting: Internal Medicine

## 2020-08-16 ENCOUNTER — Other Ambulatory Visit: Payer: Self-pay | Admitting: Internal Medicine

## 2020-08-16 DIAGNOSIS — I10 Essential (primary) hypertension: Secondary | ICD-10-CM

## 2020-08-16 NOTE — Telephone Encounter (Signed)
   Notes to clinic  Pharmacy notes state that pt quit taking this in October, please assess.

## 2020-09-15 ENCOUNTER — Other Ambulatory Visit: Payer: Self-pay | Admitting: Internal Medicine

## 2020-09-15 DIAGNOSIS — I10 Essential (primary) hypertension: Secondary | ICD-10-CM

## 2020-09-15 NOTE — Telephone Encounter (Signed)
08/19/20 refill on chart states patient must have appointment for further refills.  LOV 03/25/20 TC to the patient-1st availability with pcp is 11/15/20.  Hydrodiuril refill needed at this time. Routing to clinic for consideration and for appointment scheduling.

## 2020-10-15 ENCOUNTER — Other Ambulatory Visit: Payer: Self-pay | Admitting: Internal Medicine

## 2020-10-15 NOTE — Telephone Encounter (Signed)
Requested Prescriptions  Pending Prescriptions Disp Refills  . meloxicam (MOBIC) 15 MG tablet [Pharmacy Med Name: MELOXICAM 15MG  TABLETS] 90 tablet 0    Sig: TAKE 1 TABLET(15 MG) BY MOUTH DAILY     Analgesics:  COX2 Inhibitors Failed - 10/15/2020  8:19 PM      Failed - HGB in normal range and within 360 days    Hemoglobin  Date Value Ref Range Status  10/06/2019 13.6 11.1 - 15.9 g/dL Final         Failed - Cr in normal range and within 360 days    Creat  Date Value Ref Range Status  07/09/2015 0.65 0.50 - 1.10 mg/dL Final   Creatinine, Ser  Date Value Ref Range Status  10/06/2019 0.65 0.57 - 1.00 mg/dL Final   Creatinine, POC  Date Value Ref Range Status  12/17/2016 200 mg/dL Final         Passed - Patient is not pregnant      Passed - Valid encounter within last 12 months    Recent Outpatient Visits          6 months ago Obesity (BMI 35.0-39.9 without comorbidity)   [place] Karle Plumber B, MD   9 months ago Obesity (BMI 35.0-39.9 without comorbidity)   [place] Ladell Pier, MD   1 year ago Annual physical exam   [place] Ladell Pier, MD   1 year ago Obesity (BMI 35.0-39.9 without comorbidity)   [place], MD   1 year ago Obesity (BMI 35.0-39.9 without comorbidity)   [place], MD      Future Appointments            In 3 weeks Ladell Pier, MD [place]

## 2020-10-17 ENCOUNTER — Other Ambulatory Visit: Payer: Self-pay | Admitting: Internal Medicine

## 2020-10-17 DIAGNOSIS — I10 Essential (primary) hypertension: Secondary | ICD-10-CM

## 2020-10-17 NOTE — Telephone Encounter (Signed)
   Notes to clinic:  Patient has appointment scheduled for 11/11/20 Review for another courtesy refill    Requested Prescriptions  Pending Prescriptions Disp Refills   hydrochlorothiazide (HYDRODIURIL) 12.5 MG tablet [Pharmacy Med Name: HYDROCHLOROTHIAZIDE 12.5MG  TABLETS] 60 tablet 0    Sig: TAKE 2 TABLETS(25 MG) BY MOUTH DAILY      Cardiovascular: Diuretics - Thiazide Failed - 10/17/2020  7:46 AM      Failed - Ca in normal range and within 360 days    Calcium  Date Value Ref Range Status  10/06/2019 9.6 8.7 - 10.2 mg/dL Final          Failed - Cr in normal range and within 360 days    Creat  Date Value Ref Range Status  07/09/2015 0.65 0.50 - 1.10 mg/dL Final   Creatinine, Ser  Date Value Ref Range Status  10/06/2019 0.65 0.57 - 1.00 mg/dL Final   Creatinine, POC  Date Value Ref Range Status  12/17/2016 200 mg/dL Final          Failed - K in normal range and within 360 days    Potassium  Date Value Ref Range Status  10/06/2019 4.4 3.5 - 5.2 mmol/L Final          Failed - Na in normal range and within 360 days    Sodium  Date Value Ref Range Status  10/06/2019 140 134 - 144 mmol/L Final          Failed - Last BP in normal range    BP Readings from Last 1 Encounters:  10/06/19 (!) 143/105          Failed - Valid encounter within last 6 months    Recent Outpatient Visits           6 months ago Obesity (BMI 35.0-39.9 without comorbidity)   [place], Deborah B, MD   9 months ago Obesity (BMI 35.0-39.9 without comorbidity)   [place] Ladell Pier, MD   1 year ago Annual physical exam   [place], Deborah B, MD   1 year ago Obesity (BMI 35.0-39.9 without comorbidity)   [place], Deborah B, MD   1 year ago Obesity (BMI 35.0-39.9 without comorbidity)   [place],  Deborah B, MD       Future Appointments             In 3 weeks Ladell Pier, MD [place]

## 2020-10-22 ENCOUNTER — Other Ambulatory Visit: Payer: Self-pay | Admitting: Obstetrics and Gynecology

## 2020-11-11 ENCOUNTER — Encounter: Payer: 59 | Admitting: Internal Medicine

## 2020-11-14 ENCOUNTER — Other Ambulatory Visit: Payer: Self-pay | Admitting: Internal Medicine

## 2020-11-14 DIAGNOSIS — Z1231 Encounter for screening mammogram for malignant neoplasm of breast: Secondary | ICD-10-CM

## 2020-11-15 ENCOUNTER — Encounter: Payer: 59 | Admitting: Internal Medicine

## 2020-12-02 ENCOUNTER — Ambulatory Visit
Admission: RE | Admit: 2020-12-02 | Discharge: 2020-12-02 | Disposition: A | Discharge status: Home / Self Care | Payer: 59 | Source: Ambulatory Visit | Attending: Internal Medicine | Admitting: Internal Medicine

## 2020-12-02 ENCOUNTER — Other Ambulatory Visit: Payer: Self-pay

## 2020-12-02 DIAGNOSIS — Z1231 Encounter for screening mammogram for malignant neoplasm of breast: Secondary | ICD-10-CM | POA: Diagnosis present

## 2020-12-03 ENCOUNTER — Other Ambulatory Visit: Payer: Self-pay | Admitting: Internal Medicine

## 2020-12-03 DIAGNOSIS — I10 Essential (primary) hypertension: Secondary | ICD-10-CM

## 2020-12-03 NOTE — Telephone Encounter (Signed)
Requested medication (s) are due for refill today: yes  Requested medication (s) are on the active medication list: yes  Last refill:  10/17/20 #60 0 refills  Future visit scheduled: no.   Notes to clinic:  last labs 10/06/19. Called patient to schedule app. Patient reports she was supposed to be scheduled for appt the last Friday of the  month. No appt noted in chart at this time. Patient upset no available appt until 01/30/21. Patient instructed to call office in am to review c/o no available appt this month. Do you want courtesy refill x 1 month or until appt can be scheduled?     Requested Prescriptions  Pending Prescriptions Disp Refills   hydrochlorothiazide (HYDRODIURIL) 12.5 MG tablet [Pharmacy Med Name: HYDROCHLOROTHIAZIDE 12.5MG  TABLETS] 60 tablet 0    Sig: TAKE 2 TABLETS(25 MG) BY MOUTH DAILY      Cardiovascular: Diuretics - Thiazide Failed - 12/03/2020  5:48 PM      Failed - Ca in normal range and within 360 days    Calcium  Date Value Ref Range Status  10/06/2019 9.6 8.7 - 10.2 mg/dL Final          Failed - Cr in normal range and within 360 days    Creat  Date Value Ref Range Status  07/09/2015 0.65 0.50 - 1.10 mg/dL Final   Creatinine, Ser  Date Value Ref Range Status  10/06/2019 0.65 0.57 - 1.00 mg/dL Final   Creatinine, POC  Date Value Ref Range Status  12/17/2016 200 mg/dL Final          Failed - K in normal range and within 360 days    Potassium  Date Value Ref Range Status  10/06/2019 4.4 3.5 - 5.2 mmol/L Final          Failed - Na in normal range and within 360 days    Sodium  Date Value Ref Range Status  10/06/2019 140 134 - 144 mmol/L Final          Failed - Last BP in normal range    BP Readings from Last 1 Encounters:  10/06/19 (!) 143/105          Failed - Valid encounter within last 6 months    Recent Outpatient Visits           8 months ago Obesity (BMI 35.0-39.9 without comorbidity)   [place]  Karle Plumber B, MD   10 months ago Obesity (BMI 35.0-39.9 without comorbidity)   [place] Ladell Pier, MD   1 year ago Annual physical exam   [place] Ladell Pier, MD   1 year ago Obesity (BMI 35.0-39.9 without comorbidity)   [place] Ladell Pier, MD   1 year ago Obesity (BMI 35.0-39.9 without comorbidity)   [place] Ladell Pier, MD

## 2020-12-04 ENCOUNTER — Telehealth: Payer: Self-pay | Admitting: Internal Medicine

## 2020-12-04 ENCOUNTER — Other Ambulatory Visit: Payer: Self-pay | Admitting: Internal Medicine

## 2020-12-04 DIAGNOSIS — I10 Essential (primary) hypertension: Secondary | ICD-10-CM

## 2020-12-04 NOTE — Telephone Encounter (Signed)
Medication: hydrochlorothiazide (HYDRODIURIL) 12.5 MG tablet [[id number]]  Apt 02/07/21 Has the patient contacted their pharmacy? YES  (Agent: If no, request that the patient contact the pharmacy for the refill.) (Agent: If yes, when and what did the pharmacy advise?)  Preferred Pharmacy (with phone number or street name): [facility] [address] [place], [address] [place] [address] Phone: [phone] Fax: [phone] Hours: Open 24 hours    Agent: Please be advised that RX refills may take up to 3 business days. We ask that you follow-up with your pharmacy.

## 2020-12-04 NOTE — Telephone Encounter (Signed)
Patient reports she was supposed to be scheduled for appt the last Friday of the  month. No appt noted in chart at this time. Patient upset no available appt until 02/07/21. Pt states that she took off work for the apt. Please advise [phone]

## 2020-12-04 NOTE — Telephone Encounter (Signed)
  Notes to clinic: Patient has appt on 02/04/2021 Review for refill until that time   Requested Prescriptions  Pending Prescriptions Disp Refills   hydrochlorothiazide (HYDRODIURIL) 12.5 MG tablet 60 tablet 0    Sig: TAKE 2 TABLETS(25 MG) BY MOUTH DAILY      Cardiovascular: Diuretics - Thiazide Failed - 12/04/2020 10:09 AM      Failed - Ca in normal range and within 360 days    Calcium  Date Value Ref Range Status  10/06/2019 9.6 8.7 - 10.2 mg/dL Final          Failed - Cr in normal range and within 360 days    Creat  Date Value Ref Range Status  07/09/2015 0.65 0.50 - 1.10 mg/dL Final   Creatinine, Ser  Date Value Ref Range Status  10/06/2019 0.65 0.57 - 1.00 mg/dL Final   Creatinine, POC  Date Value Ref Range Status  12/17/2016 200 mg/dL Final          Failed - K in normal range and within 360 days    Potassium  Date Value Ref Range Status  10/06/2019 4.4 3.5 - 5.2 mmol/L Final          Failed - Na in normal range and within 360 days    Sodium  Date Value Ref Range Status  10/06/2019 140 134 - 144 mmol/L Final          Failed - Last BP in normal range    BP Readings from Last 1 Encounters:  10/06/19 (!) 143/105          Failed - Valid encounter within last 6 months    Recent Outpatient Visits           8 months ago Obesity (BMI 35.0-39.9 without comorbidity)   [place], Deborah B, MD   10 months ago Obesity (BMI 35.0-39.9 without comorbidity)   [place] Ladell Pier, MD   1 year ago Annual physical exam   [place], Deborah B, MD   1 year ago Obesity (BMI 35.0-39.9 without comorbidity)   [place], Deborah B, MD   1 year ago Obesity (BMI 35.0-39.9 without comorbidity)   [place], Deborah B, MD       Future Appointments             In 2 months  Wynetta Emery Dalbert Batman, MD [place]

## 2020-12-04 NOTE — Telephone Encounter (Signed)
Pt is calling to order for annual blood work to be placed to [facility]. Pt medication refill appt scheduled in August. CB- [phone]

## 2020-12-05 NOTE — Telephone Encounter (Signed)
Returned pt call pt states she is needing her hctz refilled made pt aware that since she hasn't had bloodwork in a year Dr. Wynetta Emery will have to approve medication. Made pt aware that once Dr. Wynetta Emery lets me know I will give her a call. Pt asked when will provider see message. Made pt aware that provider is in clinic at this moment but she checks her messages after. Pt states she understands and doesn't have any questions or concerns

## 2020-12-05 NOTE — Telephone Encounter (Signed)
Returned pt call pt states she already has an appt scheduled

## 2021-02-04 ENCOUNTER — Ambulatory Visit: Payer: 59 | Admitting: Internal Medicine

## 2021-02-05 ENCOUNTER — Other Ambulatory Visit: Payer: Self-pay

## 2021-02-05 ENCOUNTER — Encounter: Payer: Self-pay | Admitting: Nurse Practitioner

## 2021-02-05 ENCOUNTER — Ambulatory Visit: Payer: 59 | Attending: Nurse Practitioner | Admitting: Nurse Practitioner

## 2021-02-05 VITALS — BP 109/77 | HR 93 | Ht 64.0 in | Wt 216.0 lb

## 2021-02-05 DIAGNOSIS — E669 Obesity, unspecified: Secondary | ICD-10-CM

## 2021-02-05 DIAGNOSIS — Z6837 Body mass index (BMI) 37.0-37.9, adult: Secondary | ICD-10-CM

## 2021-02-05 DIAGNOSIS — R7303 Prediabetes: Secondary | ICD-10-CM

## 2021-02-05 DIAGNOSIS — E785 Hyperlipidemia, unspecified: Secondary | ICD-10-CM

## 2021-02-05 DIAGNOSIS — E559 Vitamin D deficiency, unspecified: Secondary | ICD-10-CM | POA: Diagnosis not present

## 2021-02-05 DIAGNOSIS — Z1211 Encounter for screening for malignant neoplasm of colon: Secondary | ICD-10-CM

## 2021-02-05 DIAGNOSIS — I10 Essential (primary) hypertension: Secondary | ICD-10-CM

## 2021-02-05 DIAGNOSIS — D259 Leiomyoma of uterus, unspecified: Secondary | ICD-10-CM

## 2021-02-05 NOTE — Progress Notes (Signed)
Assessment & Plan:  [name] was seen today for hypertension.  Diagnoses and all orders for this visit:  Essential hypertension -     CMP14+EGFR -     Amb ref to Medical Nutrition Therapy-MNT Continue all antihypertensives as prescribed.  Remember to bring in your blood pressure log with you for your follow up appointment.  DASH/Mediterranean Diets are healthier choices for HTN.    Prediabetes -     Amb ref to Medical Nutrition Therapy-MNT -     Hemoglobin A1c -     Amb Ref to Medical Weight Management  Dyslipidemia, goal LDL below 100 -     Lipid panel -     Amb ref to Medical Nutrition Therapy-MNT -     Amb Ref to Medical Weight Management INSTRUCTIONS: Work on a low fat, heart healthy diet and participate in regular aerobic exercise program by working out at least 150 minutes per week; 5 days a week-30 minutes per day. Avoid red meat/beef/steak,  fried foods. junk foods, sodas, sugary drinks, unhealthy snacking, alcohol and smoking.  Drink at least 80 oz of water per day and monitor your carbohydrate intake daily.    Obesity (BMI 30-39.9) Discussed diet and exercise for person with BMI >37. Instructed: You must burn more calories than you eat. Losing 5 percent of your body weight should be considered a success. In the longer term, losing more than 15 percent of your body weight and staying at this weight is an extremely good result. However, keep in mind that even losing 5 percent of your body weight leads to important health benefits, so try not to get discouraged if you're not able to lose more than this. Will recheck weight in 3-6 months.   Colon cancer screening -     Ambulatory referral to Gastroenterology  Vitamin D deficiency disease -     VITAMIN D 25 Hydroxy (Vit-D Deficiency, Fractures)  Uterine leiomyoma, unspecified location -     CBC   Patient has been counseled on age-appropriate routine health concerns for screening and prevention. These are reviewed and  up-to-date. Referrals have been placed accordingly. Immunizations are up-to-date or declined.    Subjective:   Chief Complaint  Patient presents with   Hypertension   Hypertension Pertinent negatives include no blurred vision, chest pain, headaches, malaise/fatigue, palpitations or shortness of breath.  Erin Glenn 50 y.o. female presents to office today for BP check She has a past medical history of Fibroid uterus (2014) and Unspecified vitamin D deficiency (09/26/2013).    HTN She monitors her blood pressure at home. Last reading 3 days ago: 137/98. She is only taking hydrochlorothiazide 25 mg "as needed".  States she does not take it when her blood pressure is normal.  I did instruct her that the prescription from her primary care states to take 2 tablets by mouth daily. Denies chest pain, shortness of breath, palpitations, lightheadedness, dizziness, headaches or BLE edema.   BP Readings from Last 3 Encounters:  02/05/21 109/77  10/06/19 (!) 143/105  07/29/19 (!) 155/84    Obesity  BMI 37.08.  She is requesting to restart phentermine which I will defer to her primary care.  She states her current insurance does not cover the bariatric clinic she was going to and so she needs a referral to an in network weight loss clinic and nutritionist.  Currently has a [employer]. Wt Readings from Last 3 Encounters:  02/05/21 216 lb (98 kg)  10/06/19 221  lb 9.6 oz (100.5 kg)  07/29/19 218 lb (98.9 kg)    Prediabetes Well-controlled at this time without the use of any oral diabetic medications. Review of Systems  Constitutional:  Negative for fever, malaise/fatigue and weight loss.  HENT: Negative.  Negative for nosebleeds.   Eyes: Negative.  Negative for blurred vision, double vision and photophobia.  Respiratory: Negative.  Negative for cough and shortness of breath.   Cardiovascular: Negative.  Negative for chest pain, palpitations and leg swelling.  Gastrointestinal:  Negative.  Negative for heartburn, nausea and vomiting.  Musculoskeletal: Negative.  Negative for myalgias.  Neurological: Negative.  Negative for dizziness, focal weakness, seizures and headaches.  Psychiatric/Behavioral: Negative.  Negative for suicidal ideas.    Past Medical History:  Diagnosis Date   Fibroid uterus 2014   Unspecified vitamin D deficiency 09/26/2013   urgent care at [facility]    Past Surgical History:  Procedure Laterality Date   BREAST BIOPSY Left 09/2019   [place] bx, ribbon, ATYPICAL LYMPHOID PROLIFERATION   BREAST BIOPSY Left 12/14/2019   [place] bx, venus marker, path pending   DILATION AND CURETTAGE OF UTERUS  2019    Family History  Problem Relation Age of Onset   Diabetic kidney disease Mother    Hypertension Mother    Stroke Mother    Heart disease Mother    Hyperlipidemia Mother    Diabetes Mother    Alcoholism Father    Hypertension Father    Cancer - Other Father 84       throat   Diabetes Father    Breast cancer Neg Hx     Social History Reviewed with no changes to be made today.   Outpatient Medications Prior to Visit  Medication Sig Dispense Refill   hydrochlorothiazide (HYDRODIURIL) 12.5 MG tablet TAKE 2 TABLETS(25 MG) BY MOUTH DAILY 60 tablet 1   meloxicam (MOBIC) 15 MG tablet TAKE 1 TABLET(15 MG) BY MOUTH DAILY 90 tablet 0   phentermine 30 MG capsule TAKE 1 CAPSULE(30 MG) BY MOUTH EVERY MORNING 30 capsule 0   No facility-administered medications prior to visit.    Allergies  Allergen Reactions   Hydrocodone-Acetaminophen Nausea Only       Objective:    BP 109/77   Pulse 93   Ht 5' 4"  (1.626 m)   Wt 216 lb (98 kg)   LMP 05/16/2016   SpO2 99%   BMI 37.08 kg/m  Wt Readings from Last 3 Encounters:  02/05/21 216 lb (98 kg)  10/06/19 221 lb 9.6 oz (100.5 kg)  07/29/19 218 lb (98.9 kg)    Physical Exam Vitals and nursing note reviewed.  Constitutional:      Appearance: She is well-developed.  HENT:     Head:  Normocephalic and atraumatic.  Cardiovascular:     Rate and Rhythm: Normal rate and regular rhythm.     Heart sounds: Normal heart sounds. No murmur heard.   No friction rub. No gallop.  Pulmonary:     Effort: Pulmonary effort is normal. No tachypnea or respiratory distress.     Breath sounds: Normal breath sounds. No decreased breath sounds, wheezing, rhonchi or rales.  Chest:     Chest wall: No tenderness.  Abdominal:     General: Bowel sounds are normal.     Palpations: Abdomen is soft.  Musculoskeletal:        General: Normal range of motion.     Cervical back: Normal range of motion.  Skin:    General: Skin  is warm and dry.  Neurological:     Mental Status: She is alert and oriented to person, place, and time.     Coordination: Coordination normal.  Psychiatric:        Behavior: Behavior normal. Behavior is cooperative.        Thought Content: Thought content normal.        Judgment: Judgment normal.         Patient has been counseled extensively about nutrition and exercise as well as the importance of adherence with medications and regular follow-up. The patient was given clear instructions to go to ER or return to medical center if symptoms don't improve, worsen or new problems develop. The patient verbalized understanding.   Follow-up: Return for f/u PCP in 3 months. Dr Wynetta Emery.   Gildardo Pounds, FNP-BC [facility] [place], [place]   02/05/2021, 3:08 PM

## 2021-02-06 ENCOUNTER — Other Ambulatory Visit: Payer: Self-pay | Admitting: Internal Medicine

## 2021-02-06 DIAGNOSIS — I10 Essential (primary) hypertension: Secondary | ICD-10-CM

## 2021-02-06 LAB — LIPID PANEL
Chol/HDL Ratio: 2.8 ratio (ref 0.0–4.4)
Cholesterol, Total: 169 mg/dL (ref 100–199)
HDL: 60 mg/dL (ref >39)
LDL Chol Calc (NIH): 88 mg/dL (ref 0–99)
Triglycerides: 118 mg/dL (ref 0–149)
VLDL Cholesterol Cal: 21 mg/dL (ref 5–40)

## 2021-02-06 LAB — HEMOGLOBIN A1C
Est. average glucose Bld gHb Est-mCnc: 123 mg/dL
Hgb A1c MFr Bld: 5.9 % — ABNORMAL HIGH (ref 4.8–5.6)

## 2021-02-06 LAB — CBC
Hematocrit: 40.3 % (ref 34.0–46.6)
Hemoglobin: 13.6 g/dL (ref 11.1–15.9)
MCH: 27.6 pg (ref 26.6–33.0)
MCHC: 33.7 g/dL (ref 31.5–35.7)
MCV: 82 fL (ref 79–97)
Platelets: 283 10*3/uL (ref 150–450)
RBC: 4.92 x10E6/uL (ref 3.77–5.28)
RDW: 14.5 % (ref 11.7–15.4)
WBC: 7.7 10*3/uL (ref 3.4–10.8)

## 2021-02-06 LAB — CMP14+EGFR
ALT: 16 IU/L (ref 0–32)
AST: 19 IU/L (ref 0–40)
Albumin/Globulin Ratio: 1.2 (ref 1.2–2.2)
Albumin: 4.1 g/dL (ref 3.8–4.8)
Alkaline Phosphatase: 80 IU/L (ref 44–121)
BUN/Creatinine Ratio: 14 (ref 9–23)
BUN: 10 mg/dL (ref 6–24)
Bilirubin Total: 0.2 mg/dL (ref 0.0–1.2)
CO2: 21 mmol/L (ref 20–29)
Calcium: 9.7 mg/dL (ref 8.7–10.2)
Chloride: 104 mmol/L (ref 96–106)
Creatinine, Ser: 0.73 mg/dL (ref 0.57–1.00)
Globulin, Total: 3.5 g/dL (ref 1.5–4.5)
Glucose: 84 mg/dL (ref 65–99)
Potassium: 4 mmol/L (ref 3.5–5.2)
Sodium: 141 mmol/L (ref 134–144)
Total Protein: 7.6 g/dL (ref 6.0–8.5)
eGFR: 102 mL/min/{1.73_m2} (ref >59)

## 2021-02-06 LAB — VITAMIN D 25 HYDROXY (VIT D DEFICIENCY, FRACTURES): Vit D, 25-Hydroxy: 20.3 ng/mL — ABNORMAL LOW (ref 30.0–100.0)

## 2021-02-10 ENCOUNTER — Telehealth: Payer: Self-pay | Admitting: Internal Medicine

## 2021-02-10 DIAGNOSIS — E669 Obesity, unspecified: Secondary | ICD-10-CM

## 2021-02-10 MED ORDER — PHENTERMINE HCL 30 MG PO CAPS: ORAL_CAPSULE | ORAL | 1 refills | Status: DC

## 2021-02-10 NOTE — Telephone Encounter (Addendum)
Medication: phentermine 37.5 MG capsule Has the pt contacted their pharmacy? no  Preferred pharmacy: [facility] [place], [place]  Please be advised refills may take up to 3 business days.  We ask that you follow up with your pharmacy.  Pt states she saw Zelda on 8/17 and was advised Zelda could not refill this medication. Pt does not want 30 mg either. She wants 37.5 mg. Pt states she did her labs as well the other day.

## 2021-03-31 ENCOUNTER — Ambulatory Visit: Payer: 59 | Admitting: Dietician

## 2021-04-21 ENCOUNTER — Ambulatory Visit: Payer: Self-pay | Admitting: *Deleted

## 2021-04-21 DIAGNOSIS — H5789 Other specified disorders of eye and adnexa: Secondary | ICD-10-CM

## 2021-04-21 NOTE — Telephone Encounter (Signed)
"  Hair dye got into patient right eye yesterday and she tried flushing her eye out with water. Patient eye is swollen, irritated and red. Patient job thinks she has pink eye and she does not. Patient seeking clinical advice and declined appointment for tomorrow. "   Pt reports hair dye got into right eye this AM when rinsing it out of her hair. States immediately flushed out. Reports right eye red in corner and sore. Denies visual changes. Per protocol, since unknown chemical, consulted Poison Control, pt transferred to [facility] Assured pt NT would route to practice for PCPs review.   Reason for Disposition  [1] Unknown chemical AND [2] NO eye symptoms (e.g., pain)  Answer Assessment - Initial Assessment Questions 1. TYPE OF CHEMICAL: "What's the name of the chemical?" If a brand name, ask "What's in it?"      Hair dye 2. ONSET: "When did it happen?" (minutes or hours ago)      This am, while rinsing it out 3. MECHANISM: "How did it happen?" ""How much got in your eye?" (e.g., a drop, a splash)     While rinsing out 4. FIRST AID: "What have you done so far?"  (e.g., immediate flushing [irrigation] is often essential)     Rinsed it out immediately 5. VISION: "Do you have blurred vision?"      no 6. PAIN: "Is it painful?" If Yes, ask: "How bad is the pain?"  (Scale 1-10; or mild, moderate, severe)     3/10 7. CONTACTS: "Do you wear contacts?"     no 8. OTHER SYMPTOMS: "Do you have any other symptoms?"     Red in corner  of eye  Protocols used: Eye - Chemical In-A-AH

## 2021-04-21 NOTE — Addendum Note (Signed)
Addended by: Karle Plumber B on: 04/21/2021 06:26 PM   Modules accepted: Orders

## 2021-04-22 NOTE — Telephone Encounter (Signed)
Contacted pt and made aware that provider has placed referral

## 2021-05-09 ENCOUNTER — Other Ambulatory Visit: Payer: Self-pay | Admitting: Pharmacist

## 2021-05-09 ENCOUNTER — Other Ambulatory Visit: Payer: Self-pay | Admitting: Internal Medicine

## 2021-05-09 ENCOUNTER — Ambulatory Visit: Payer: 59 | Admitting: Internal Medicine

## 2021-05-09 DIAGNOSIS — I10 Essential (primary) hypertension: Secondary | ICD-10-CM

## 2021-05-09 MED ORDER — HYDROCHLOROTHIAZIDE 12.5 MG PO TABS: 25.0000 mg | ORAL_TABLET | Freq: Every day | ORAL | 0 refills | Status: DC

## 2021-06-03 ENCOUNTER — Other Ambulatory Visit: Payer: Self-pay | Admitting: Internal Medicine

## 2021-06-03 DIAGNOSIS — I10 Essential (primary) hypertension: Secondary | ICD-10-CM

## 2021-06-03 NOTE — Telephone Encounter (Signed)
Requested medication (s) are due for refill today: yes  Requested medication (s) are on the active medication list: yes  Last refill:  05/09/21  Future visit scheduled: no, NO SHOW for appts in Nov, no upcoming appt scheduled.  Notes to clinic:  Please assess, has had curtesy refill, had NO SHOWs and no appt.   Requested Prescriptions  Pending Prescriptions Disp Refills   hydrochlorothiazide (HYDRODIURIL) 12.5 MG tablet [Pharmacy Med Name: HYDROCHLOROTHIAZIDE 12.5MG  TABLETS] 60 tablet 0    Sig: TAKE 2 TABLETS(25 MG) BY MOUTH DAILY     Cardiovascular: Diuretics - Thiazide Passed - 06/03/2021  6:20 AM      Passed - Ca in normal range and within 360 days    Calcium  Date Value Ref Range Status  02/05/2021 9.7 8.7 - 10.2 mg/dL Final          Passed - Cr in normal range and within 360 days    Creat  Date Value Ref Range Status  07/09/2015 0.65 0.50 - 1.10 mg/dL Final   Creatinine, Ser  Date Value Ref Range Status  02/05/2021 0.73 0.57 - 1.00 mg/dL Final   Creatinine, POC  Date Value Ref Range Status  12/17/2016 200 mg/dL Final          Passed - K in normal range and within 360 days    Potassium  Date Value Ref Range Status  02/05/2021 4.0 3.5 - 5.2 mmol/L Final          Passed - Na in normal range and within 360 days    Sodium  Date Value Ref Range Status  02/05/2021 141 134 - 144 mmol/L Final          Passed - Last BP in normal range    BP Readings from Last 1 Encounters:  02/05/21 109/77          Passed - Valid encounter within last 6 months    Recent Outpatient Visits           3 months ago Essential hypertension   [place], Vernia Buff, NP   1 year ago Obesity (BMI 35.0-39.9 without comorbidity)   [place] Ladell Pier, MD   1 year ago Obesity (BMI 35.0-39.9 without comorbidity)   [place] Ladell Pier, MD   1 year ago Annual physical  exam   [place] Ladell Pier, MD   1 year ago Obesity (BMI 35.0-39.9 without comorbidity)   [facility] Ladell Pier, MD

## 2021-06-19 ENCOUNTER — Other Ambulatory Visit: Payer: Self-pay | Admitting: Internal Medicine

## 2021-06-19 DIAGNOSIS — I10 Essential (primary) hypertension: Secondary | ICD-10-CM

## 2021-06-19 NOTE — Telephone Encounter (Signed)
Requested Prescriptions  Pending Prescriptions Disp Refills   hydrochlorothiazide (HYDRODIURIL) 12.5 MG tablet 60 tablet 0    Sig: Take 2 tablets (25 mg total) by mouth daily.     Cardiovascular: Diuretics - Thiazide Passed - 06/19/2021  4:25 PM      Passed - Ca in normal range and within 360 days    Calcium  Date Value Ref Range Status  02/05/2021 9.7 8.7 - 10.2 mg/dL Final         Passed - Cr in normal range and within 360 days    Creat  Date Value Ref Range Status  07/09/2015 0.65 0.50 - 1.10 mg/dL Final   Creatinine, Ser  Date Value Ref Range Status  02/05/2021 0.73 0.57 - 1.00 mg/dL Final   Creatinine, POC  Date Value Ref Range Status  12/17/2016 200 mg/dL Final         Passed - K in normal range and within 360 days    Potassium  Date Value Ref Range Status  02/05/2021 4.0 3.5 - 5.2 mmol/L Final         Passed - Na in normal range and within 360 days    Sodium  Date Value Ref Range Status  02/05/2021 141 134 - 144 mmol/L Final         Passed - Last BP in normal range    BP Readings from Last 1 Encounters:  02/05/21 109/77         Passed - Valid encounter within last 6 months    Recent Outpatient Visits          4 months ago Essential hypertension   [place], Vernia Buff, NP   1 year ago Obesity (BMI 35.0-39.9 without comorbidity)   [place], Deborah B, MD   1 year ago Obesity (BMI 35.0-39.9 without comorbidity)   [place] Ladell Pier, MD   1 year ago Annual physical exam   [place] Ladell Pier, MD   1 year ago Obesity (BMI 35.0-39.9 without comorbidity)   [place], Deborah B, MD      Future Appointments            In 1 month Wynetta Emery, Dalbert Batman, MD [place]

## 2021-06-19 NOTE — Telephone Encounter (Signed)
Medication Refill - Medication: hydrochlorothiazide (HYDRODIURIL) 12.5 MG tablet  Has the patient contacted their pharmacy? Yes.   (But she said nothing has been sent in. Preferred Pharmacy (with phone number or street name): [id number] - [place], [address] [place] Has the patient been seen for an appointment in the last year OR does the patient have an upcoming appointment? Yes.    Pt states she is out of medication, and was told she could not make appt until she got a new health plan. Not sure who would have told her that. But she will be with [facility], and in [place].

## 2021-07-28 ENCOUNTER — Ambulatory Visit: Payer: Self-pay | Admitting: Internal Medicine

## 2021-09-15 ENCOUNTER — Other Ambulatory Visit: Payer: Self-pay | Admitting: Internal Medicine

## 2021-09-15 ENCOUNTER — Ambulatory Visit: Payer: Self-pay | Admitting: *Deleted

## 2021-09-15 DIAGNOSIS — I10 Essential (primary) hypertension: Secondary | ICD-10-CM

## 2021-09-15 NOTE — Telephone Encounter (Signed)
Summary: pt requests sooner appt for refill of blood pressure medication  ? Pt insists on speaking with someone for a sooner appt due to needing blood pressure medication. Pt declined the appt that was offered and requests call back to discuss getting a sooner appt. Cb# [phone]   ?  ?Attempted to call patient- left message to call office ?

## 2021-09-15 NOTE — Telephone Encounter (Signed)
Requested medication (s) are due for refill today: yes ? ?Requested medication (s) are on the active medication list: yes ? ?Last refill:  06/19/21 #180 with 0 RF ? ?Future visit scheduled: 10/08/21 ? ?Notes to clinic:  pt states she had lab work done at an office on [address]. Appt made. ? ? ?  ? ?Requested Prescriptions  ?Pending Prescriptions Disp Refills  ? hydrochlorothiazide (HYDRODIURIL) 12.5 MG tablet [Pharmacy Med Name: HYDROCHLOROTHIAZIDE 12.'5MG'$  TABLETS] 180 tablet 0  ?  Sig: TAKE 2 TABLETS(25 MG) BY MOUTH DAILY  ?  ? Cardiovascular: Diuretics - Thiazide Failed - 09/15/2021  9:29 AM  ?  ?  Failed - Cr in normal range and within 180 days  ?  Creat  ?Date Value Ref Range Status  ?07/09/2015 0.65 0.50 - 1.10 mg/dL Final  ? ?Creatinine, Ser  ?Date Value Ref Range Status  ?02/05/2021 0.73 0.57 - 1.00 mg/dL Final  ? ?Creatinine, POC  ?Date Value Ref Range Status  ?12/17/2016 200 mg/dL Final  ?  ?  ?  ?  Failed - K in normal range and within 180 days  ?  Potassium  ?Date Value Ref Range Status  ?02/05/2021 4.0 3.5 - 5.2 mmol/L Final  ?  ?  ?  ?  Failed - Na in normal range and within 180 days  ?  Sodium  ?Date Value Ref Range Status  ?02/05/2021 141 134 - 144 mmol/L Final  ?  ?  ?  ?  Failed - Valid encounter within last 6 months  ?  Recent Outpatient Visits   ? ?      ? 7 months ago Essential hypertension  ? [place], [place] W, NP  ? 1 year ago Obesity (BMI 35.0-39.9 without comorbidity)  ? [place] Karle Plumber B, MD  ? 1 year ago Obesity (BMI 35.0-39.9 without comorbidity)  ? [place] Ladell Pier, MD  ? 1 year ago Annual physical exam  ? [place] Ladell Pier, MD  ? 2 years ago Obesity (BMI 35.0-39.9 without comorbidity)  ? [place] Ladell Pier, MD  ? ?  ?  ?Future Appointments   ? ?        ? In 3 weeks Thereasa Solo,  [place]  ? ?  ? ?  ?  ?  Passed - Last BP in normal range  ?  BP Readings from Last 1 Encounters:  ?02/05/21 109/77  ?  ?  ?  ?  ? ? ?

## 2021-09-15 NOTE — Telephone Encounter (Signed)
Left another message for pt. Message left encouraging to call back if not reached. ?

## 2021-09-15 NOTE — Telephone Encounter (Signed)
3 attempts have been made to  contact this pt.   Forwarding it to [facility] per our protocol. ?

## 2021-09-15 NOTE — Telephone Encounter (Signed)
Pt just calling back to schedule appt so can send her medication request back through. Appt made. ?

## 2021-09-16 ENCOUNTER — Other Ambulatory Visit: Payer: Self-pay | Admitting: Internal Medicine

## 2021-09-16 DIAGNOSIS — I10 Essential (primary) hypertension: Secondary | ICD-10-CM

## 2021-10-08 ENCOUNTER — Ambulatory Visit: Payer: 59 | Admitting: Physician Assistant

## 2021-10-16 ENCOUNTER — Other Ambulatory Visit: Payer: Self-pay | Admitting: Internal Medicine

## 2021-10-16 DIAGNOSIS — I10 Essential (primary) hypertension: Secondary | ICD-10-CM

## 2021-10-21 IMAGING — MG MM DIGITAL DIAGNOSTIC UNILAT*L* W/ TOMO W/ CAD
6 series · 6 of 18 positions shown · non-contrast
Comparison: Previous exam(s).

ACR Breast Density Category a: The breast tissue is almost entirely
fatty.

CLINICAL DATA: Patient recalled from screening for left breast
mass.

EXAM:
DIGITAL DIAGNOSTIC LEFT MAMMOGRAM WITH CAD AND TOMO
ULTRASOUND LEFT BREAST

[L ML synth-2D]
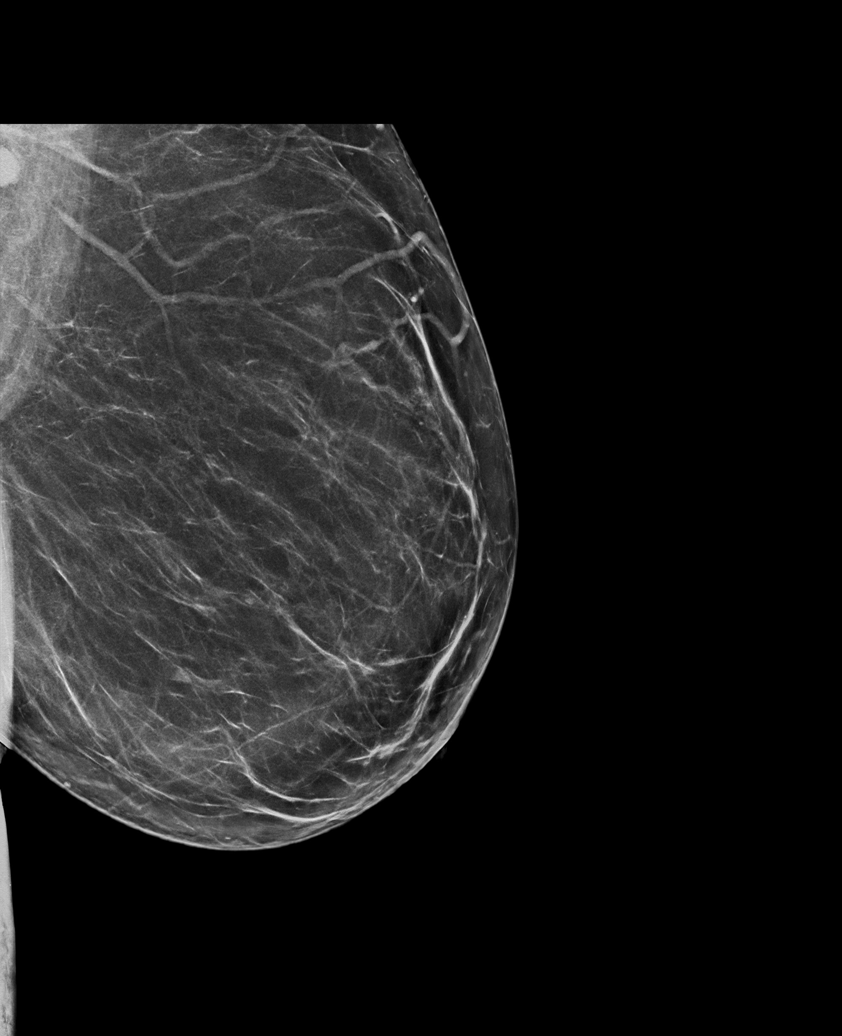

[L XCCL synth-2D]
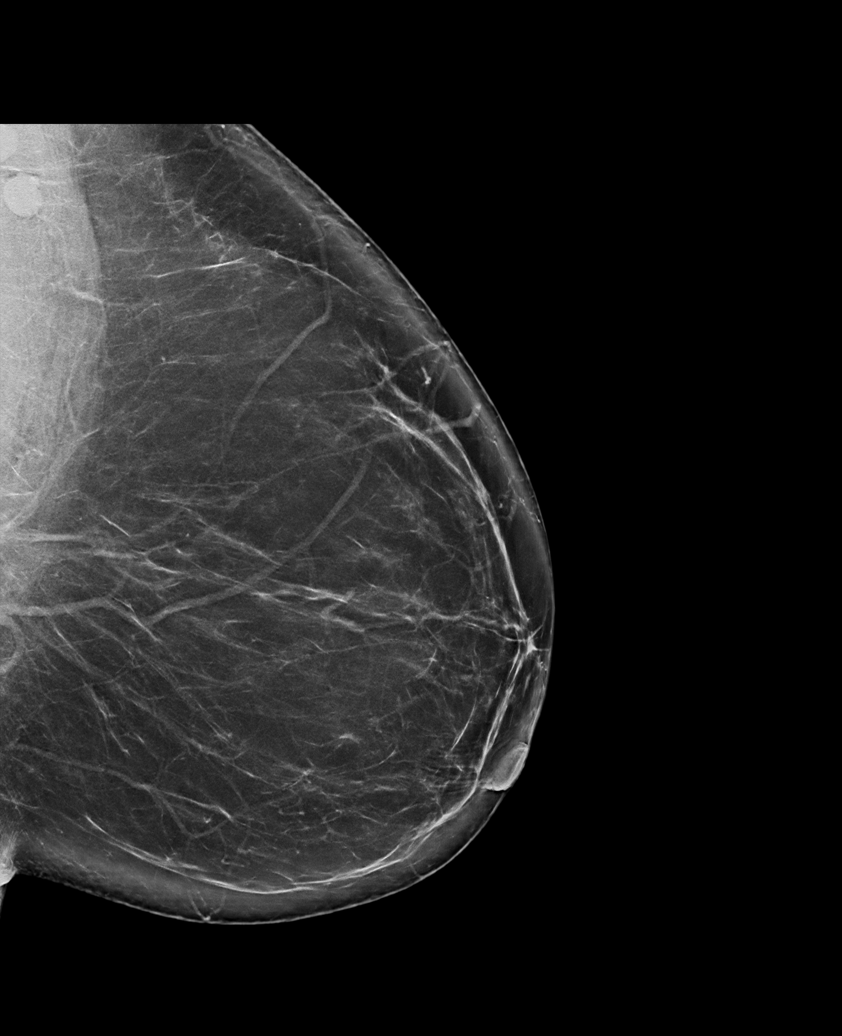

[L MLO synth-2D]
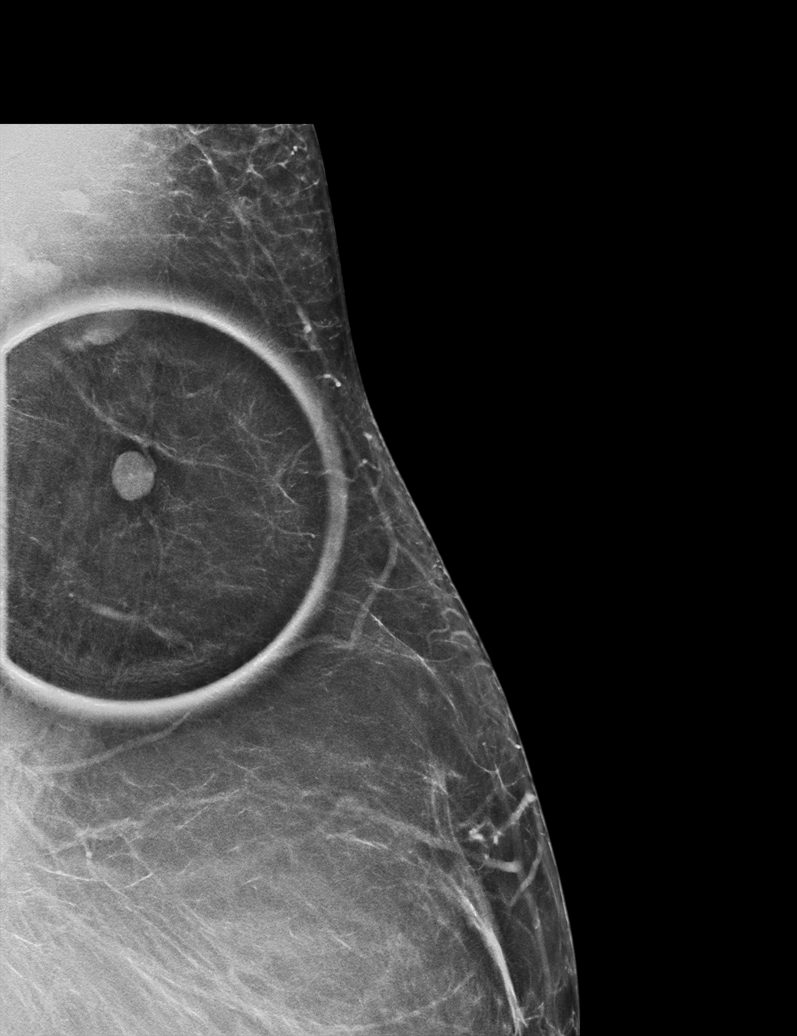

[L MLO tomo · tomo slice 38/75.0]
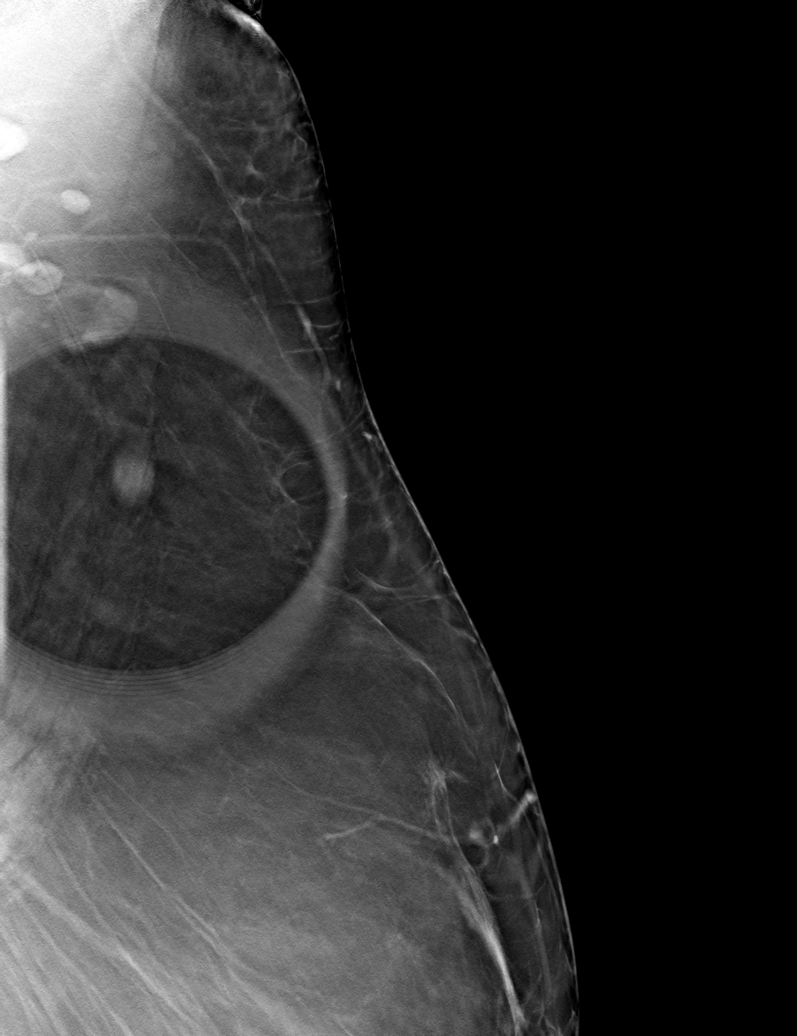

[L ML tomo · tomo slice 41/80.0]
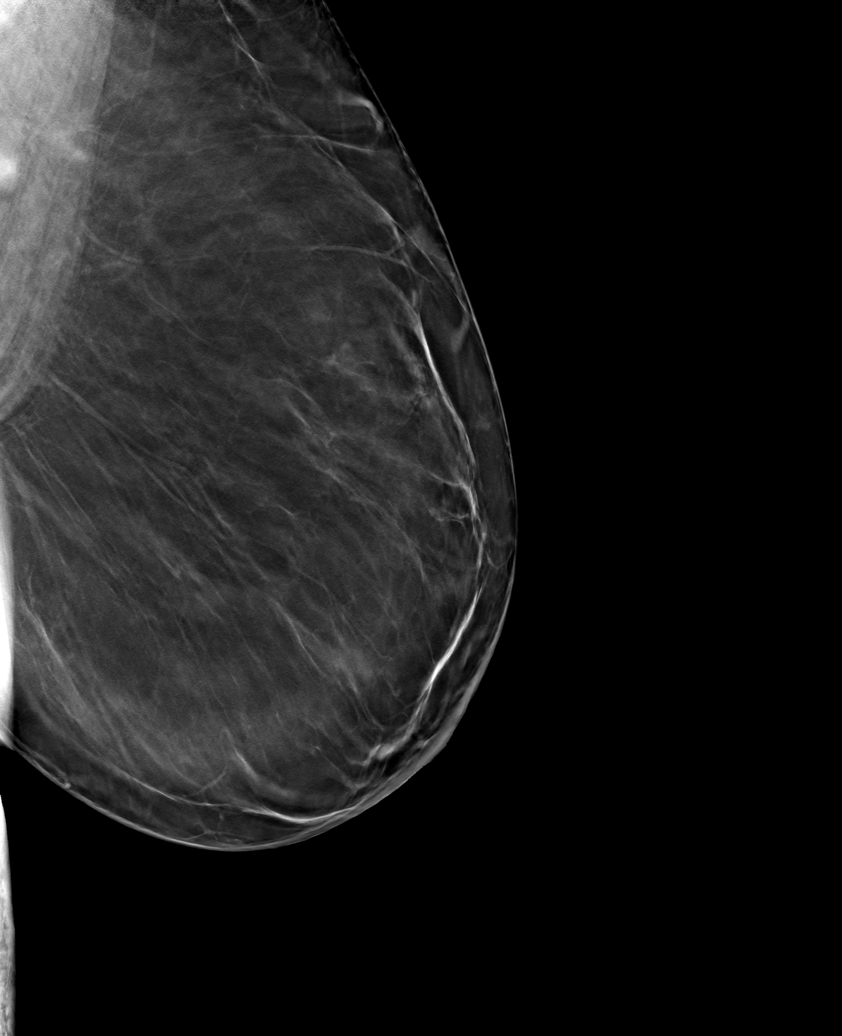

[L XCCL tomo · tomo slice 47/92.0]
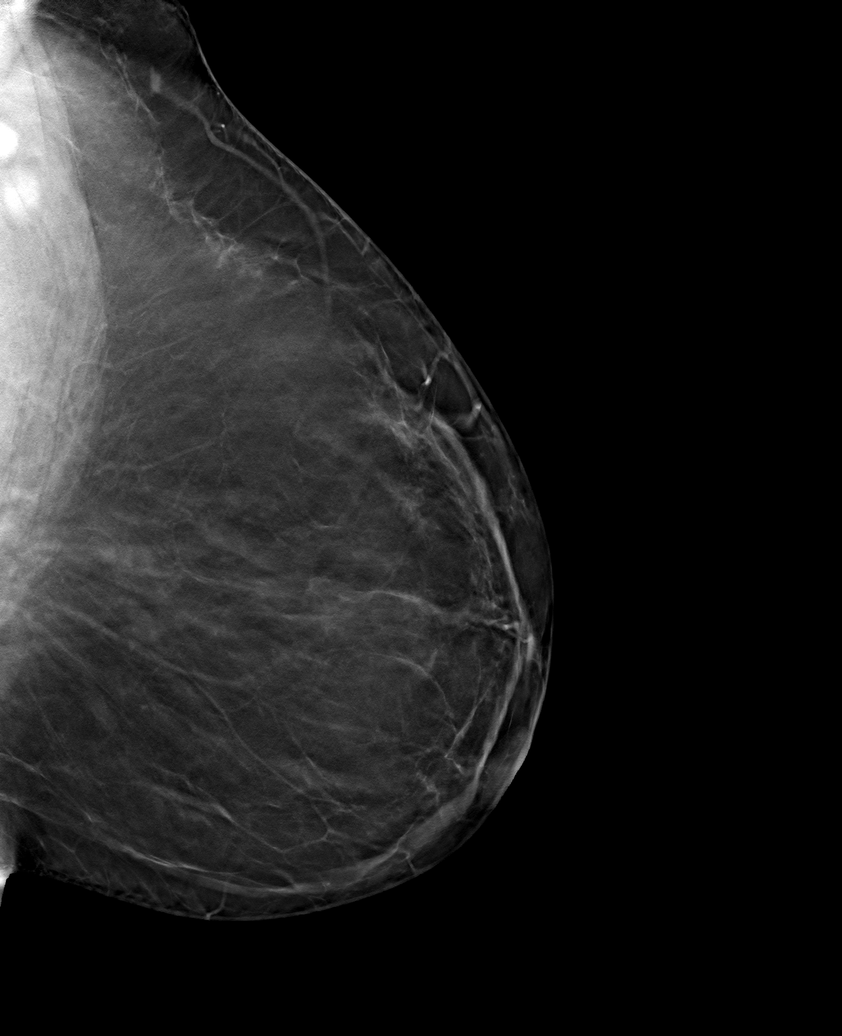

[6 of 18 positions shown; findings below may reference images not displayed]

FINDINGS: Within the upper-outer left breast posterior depth there is a
persistent oval circumscribed mass near the axillary tail.

Mammographic images were processed with CAD.

Targeted ultrasound is performed, showing an intramammary lymph node
left breast 2 o'clock position 16 cm from nipple which appears to be
mildly cortically thickened with the thickness measuring up to 4 mm.
IMPRESSION: Indeterminate intramammary lymph node secondary to cortical
thickening.

RECOMMENDATION:
Ultrasound-guided core needle biopsy intramammary lymph node left
breast 2 o'clock position 16 cm from nipple.

I have discussed the findings and recommendations with the patient.
If applicable, a reminder letter will be sent to the patient
regarding the next appointment.

BI-RADS CATEGORY  4: Suspicious.

## 2021-10-21 IMAGING — US US BREAST*L* LIMITED INC AXILLA
1 series · 6 of 6 positions shown · non-contrast
Comparison: Previous exam(s).

ACR Breast Density Category a: The breast tissue is almost entirely
fatty.

CLINICAL DATA: Patient recalled from screening for left breast
mass.

EXAM:
DIGITAL DIAGNOSTIC LEFT MAMMOGRAM WITH CAD AND TOMO
ULTRASOUND LEFT BREAST

[Series 1: us breast*left* limited inc axilla · 0.06mm/px · 6 of 6 slices shown]
[im 1/6]
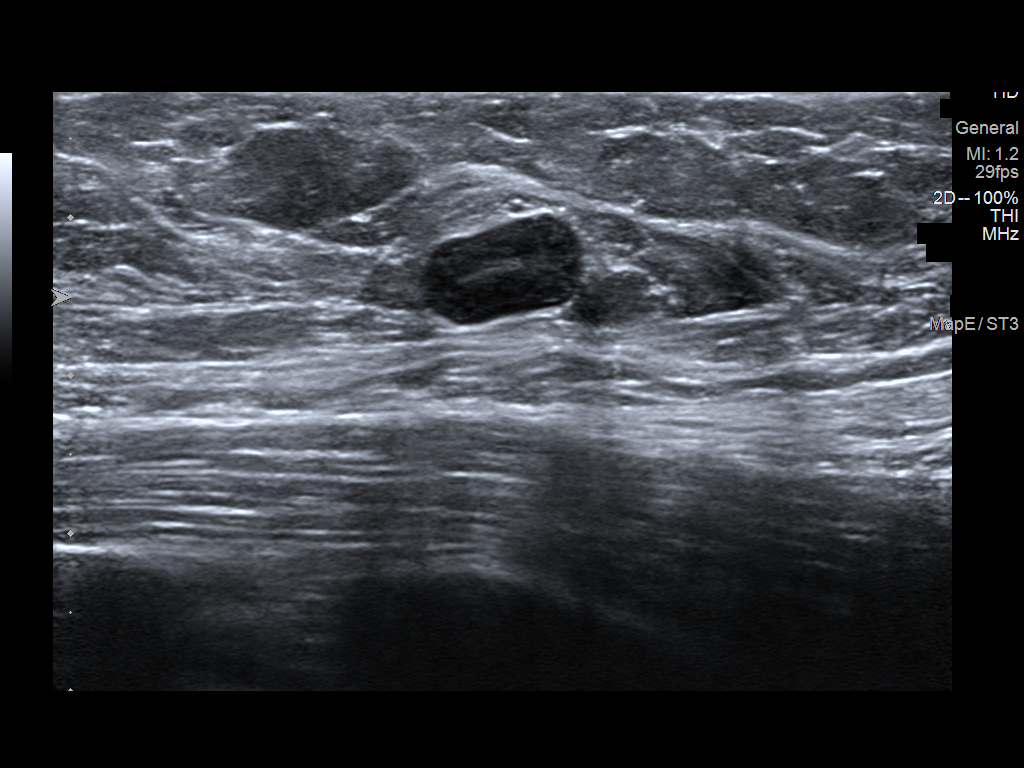
[im 2/6]
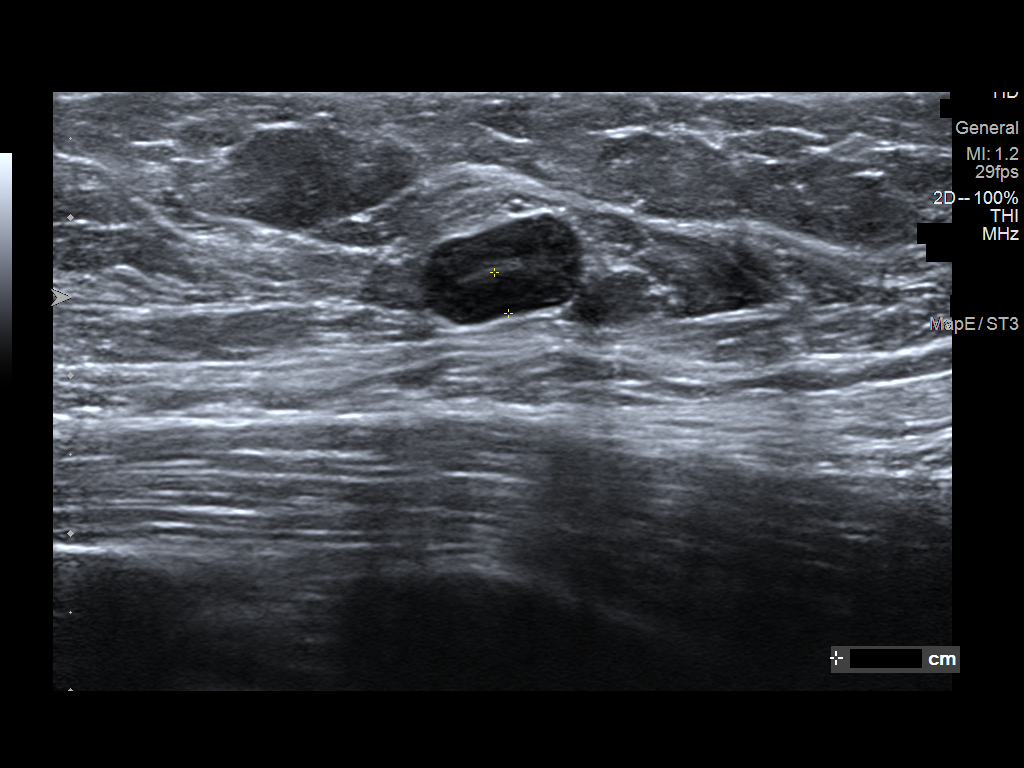
[im 3/6]
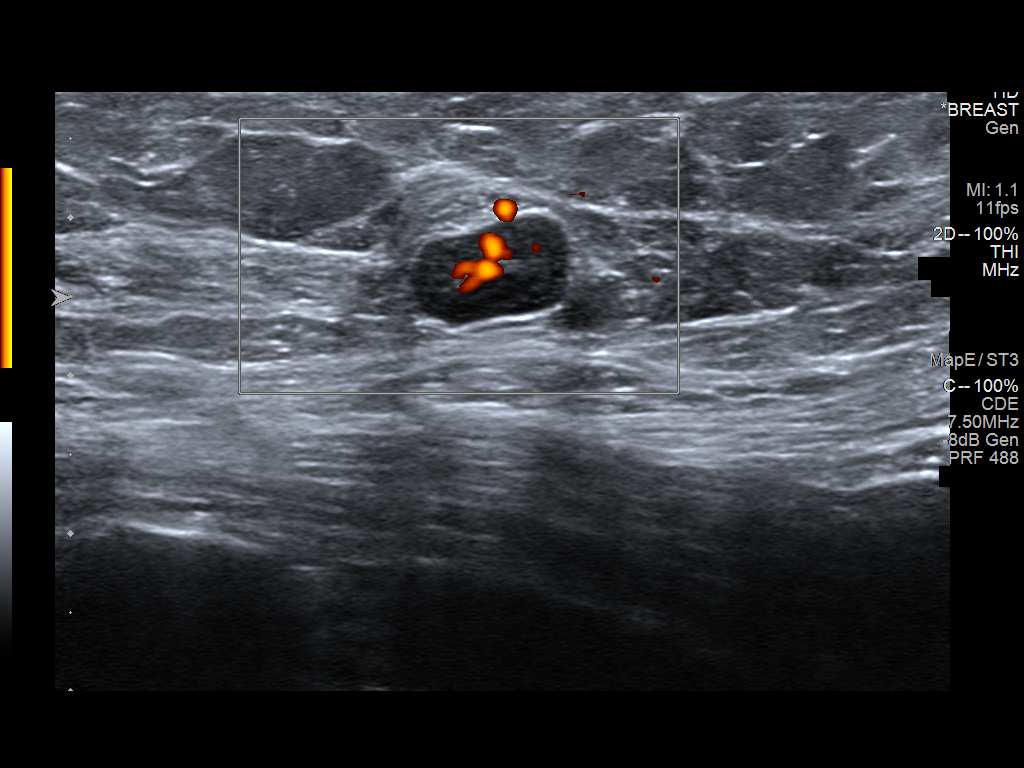
[im 4/6]
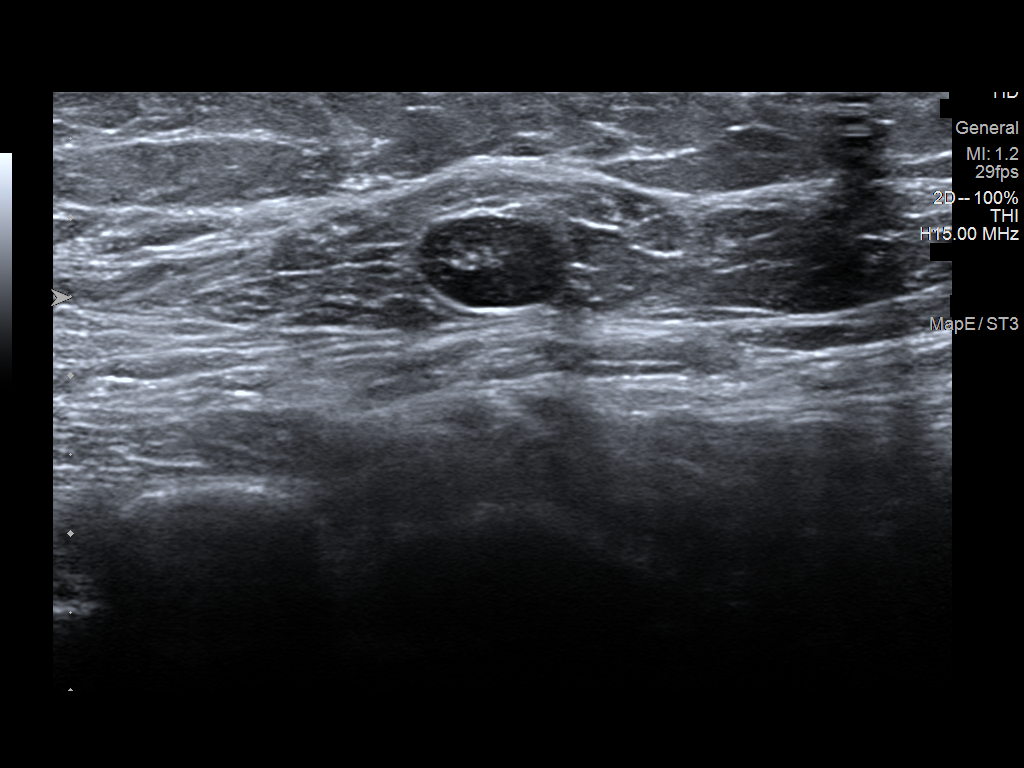
[im 5/6]
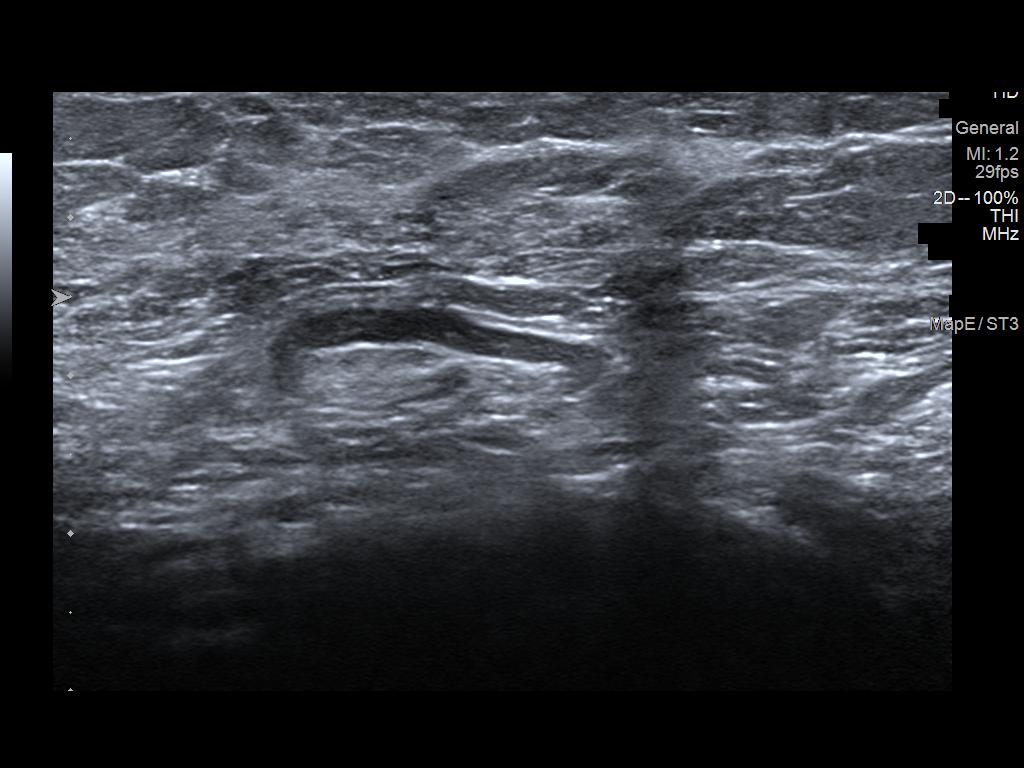
[im 6/6]
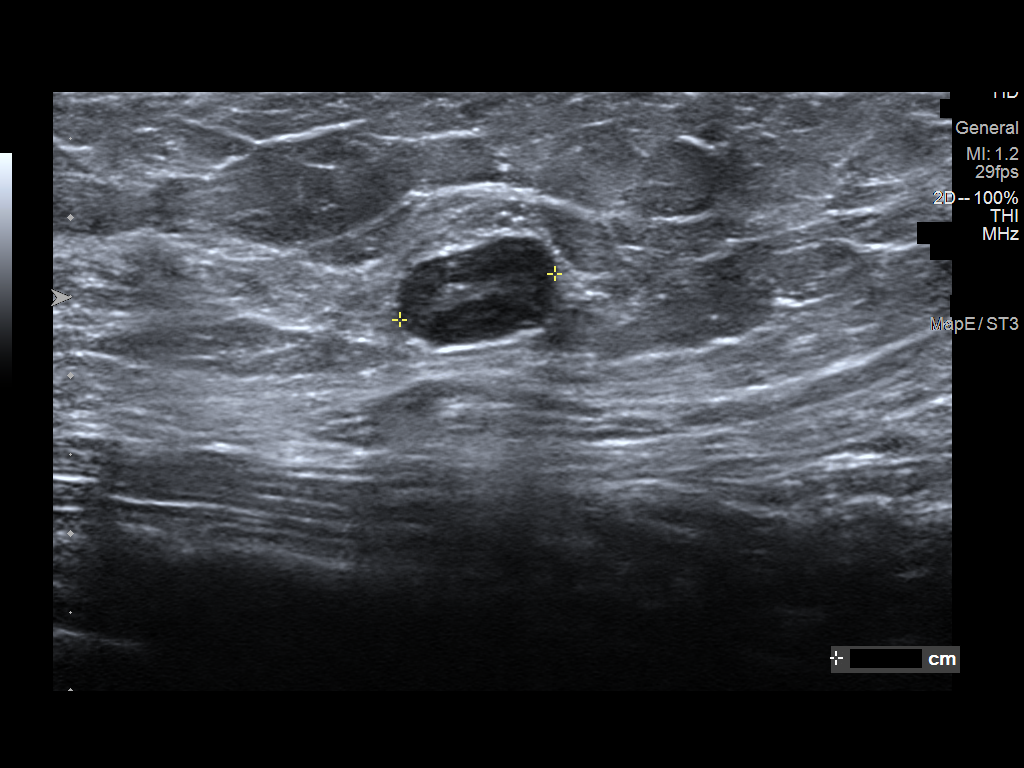

[6 of 6 positions shown; findings below may reference images not displayed]

FINDINGS: Within the upper-outer left breast posterior depth there is a
persistent oval circumscribed mass near the axillary tail.

Mammographic images were processed with CAD.

Targeted ultrasound is performed, showing an intramammary lymph node
left breast 2 o'clock position 16 cm from nipple which appears to be
mildly cortically thickened with the thickness measuring up to 4 mm.
IMPRESSION: Indeterminate intramammary lymph node secondary to cortical
thickening.

RECOMMENDATION:
Ultrasound-guided core needle biopsy intramammary lymph node left
breast 2 o'clock position 16 cm from nipple.

I have discussed the findings and recommendations with the patient.
If applicable, a reminder letter will be sent to the patient
regarding the next appointment.

BI-RADS CATEGORY  4: Suspicious.

## 2021-10-31 MED ORDER — HYDROCHLOROTHIAZIDE 12.5 MG PO TABS: ORAL_TABLET | ORAL | 0 refills | Status: DC

## 2021-10-31 NOTE — Telephone Encounter (Signed)
Pt. States she missed last appointment "because I had diarrhea." Has appointment 11/27/21. Will do courtesy refill on hydrodiuril x 1. Instructed to keep appointment. ?

## 2021-10-31 NOTE — Addendum Note (Signed)
Addended by: Linus Orn A on: 10/31/2021 09:32 AM ? ? Modules accepted: Orders ? ?

## 2021-11-04 ENCOUNTER — Encounter: Payer: Self-pay | Admitting: Nurse Practitioner

## 2021-11-04 ENCOUNTER — Telehealth (HOSPITAL_BASED_OUTPATIENT_CLINIC_OR_DEPARTMENT_OTHER): Payer: 59 | Admitting: Nurse Practitioner

## 2021-11-04 DIAGNOSIS — E669 Obesity, unspecified: Secondary | ICD-10-CM

## 2021-11-04 MED ORDER — PHENTERMINE HCL 37.5 MG PO TABS: 37.5000 mg | ORAL_TABLET | Freq: Every day | ORAL | 0 refills | Status: DC

## 2021-11-04 NOTE — Progress Notes (Signed)
Virtual Visit Note ? I discussed the limitations, risks, security and privacy concerns of performing an evaluation and management service by video and the availability of in person appointments. I also discussed with the patient that there may be a patient responsible charge related to this service. The patient expressed understanding and agreed to proceed.  ? ? ?I connected with Erin Glenn on 11/04/21  at   2:30 PM EDT  EDT by VIDEO and verified that I am speaking with the correct person using two identifiers. ? ? ?Location of Patient: ?Private Residence ?  ?Location of Provider: ?Scientist, research (physical sciences) and [employer] Office  ?  ?Persons participating in VIRTUAL visit: ?Geryl Rankins FNP-BC ?Kirstie Mirza  ?  ?History of Present Illness: ?VIRTUAL visit for: Weight loss medication ? ? ?Patient is requesting refill of phentermine today.  She was previously being followed by the bariatric center and has been prescribed phentermine and topiramate for weight loss.  Today she states her insurance no longer covers the bariatric center and she will be reestablishing with her previous primary care provider Dr. Wynetta Emery.  Dr. Wynetta Emery had previously prescribed her phentermine 30 mg capsules however she states the phentermine 37.5 mg tablet is more effective.  Most recent blood pressure reading 120/78.  Goal weight is 175 pounds.  Last recorded weight was 216. ?She has a history of hypertension and prediabetes. ? ? ?Past Medical History:  ?Diagnosis Date  ? Fibroid uterus 2014  ? Unspecified vitamin D deficiency 09/26/2013  ? urgent care at [facility]  ?  ?Past Surgical History:  ?Procedure Laterality Date  ? BREAST BIOPSY Left 09/2019  ? [place] bx, ribbon, ATYPICAL LYMPHOID PROLIFERATION  ? BREAST BIOPSY Left 12/14/2019  ? [place] bx, venus marker, path pending  ? DILATION AND CURETTAGE OF UTERUS  2019  ?  ?Family History  ?Problem Relation Age of Onset  ? Diabetic kidney disease Mother   ? Hypertension Mother   ? Stroke  Mother   ? Heart disease Mother   ? Hyperlipidemia Mother   ? Diabetes Mother   ? Alcoholism Father   ? Hypertension Father   ? Cancer - Other Father 71  ?     throat  ? Diabetes Father   ? Breast cancer Neg Hx   ?  ?Social History  ? ?Socioeconomic History  ? Marital status: Married  ?  Spouse name: Not on file  ? Number of children: Not on file  ? Years of education: Not on file  ? Highest education level: Not on file  ?Occupational History  ? Not on file  ?Tobacco Use  ? Smoking status: Never  ? Smokeless tobacco: Never  ?Substance and Sexual Activity  ? Alcohol use: No  ? Drug use: No  ? Sexual activity: Yes  ?  Birth control/protection: None  ?  Comment: G0 P0  ?Other Topics Concern  ? Not on file  ?Social History Narrative  ? Not on file  ? ?Social Determinants of Health  ? ?Financial Resource Strain: Not on file  ?Food Insecurity: Not on file  ?Transportation Needs: Not on file  ?Physical Activity: Not on file  ?Stress: Not on file  ?Social Connections: Not on file  ?  ? ?Observations/Objective: ?Awake, alert and oriented x 3 ? ? ?Review of Systems  ?Constitutional:  Negative for fever, malaise/fatigue and weight loss.  ?HENT: Negative.  Negative for nosebleeds.   ?Eyes: Negative.  Negative for blurred vision, double vision and photophobia.  ?Respiratory: Negative.  Negative for cough and shortness of breath.   ?Cardiovascular: Negative.  Negative for chest pain, palpitations and leg swelling.  ?Gastrointestinal: Negative.  Negative for heartburn, nausea and vomiting.  ?Musculoskeletal: Negative.  Negative for myalgias.  ?Neurological: Negative.  Negative for dizziness, focal weakness, seizures and headaches.  ?Psychiatric/Behavioral: Negative.  Negative for suicidal ideas.    ?Assessment and Plan: ?Diagnoses and all orders for this visit: ? ?Obesity (BMI 35.0-39.9 without comorbidity) ?-     Amb Ref to Medical Weight Management ?-     phentermine (ADIPEX-P) 37.5 MG tablet; Take 1 tablet (37.5 mg total) by  mouth daily before breakfast. ? ?  ? ?Follow Up Instructions ?Return for has appt already scheduled next month with Dr. Wynetta Emery.  ? ?  ?I discussed the assessment and treatment plan with the patient. The patient was provided an opportunity to ask questions and all were answered. The patient agreed with the plan and demonstrated an understanding of the instructions. ?  ?The patient was advised to call back or seek an in-person evaluation if the symptoms worsen or if the condition fails to improve as anticipated. ? ?I provided 12 minutes of face-to-face time during this encounter including median intraservice time, reviewing previous notes, labs, imaging, medications and explaining diagnosis and management. ? ?Gildardo Pounds, FNP-BC  ?

## 2021-11-14 ENCOUNTER — Other Ambulatory Visit: Payer: Self-pay | Admitting: Internal Medicine

## 2021-11-14 DIAGNOSIS — Z1231 Encounter for screening mammogram for malignant neoplasm of breast: Secondary | ICD-10-CM

## 2021-11-26 ENCOUNTER — Other Ambulatory Visit: Payer: Self-pay | Admitting: Internal Medicine

## 2021-11-26 ENCOUNTER — Ambulatory Visit: Payer: 59 | Admitting: Physician Assistant

## 2021-11-26 DIAGNOSIS — I10 Essential (primary) hypertension: Secondary | ICD-10-CM

## 2021-11-26 NOTE — Telephone Encounter (Signed)
Requested medication (s) are due for refill today:   Yes  Requested medication (s) are on the active medication list:   Yes  Future visit scheduled:   Yes tomorrow 6/8 Thur.   Last ordered: 10/31/2021 #60, 0 refills  Returned because labs are due and 30 day courtesy refill was given in May so provider to review for refills before appt. Tomorrow.   Requested Prescriptions  Pending Prescriptions Disp Refills   hydrochlorothiazide (HYDRODIURIL) 12.5 MG tablet [Pharmacy Med Name: HYDROCHLOROTHIAZIDE 12.'5MG'$  TABLETS] 60 tablet 0    Sig: TAKE 2 TABLETS(25 MG) BY MOUTH DAILY     Cardiovascular: Diuretics - Thiazide Failed - 11/26/2021  6:22 AM      Failed - Cr in normal range and within 180 days    Creat  Date Value Ref Range Status  07/09/2015 0.65 0.50 - 1.10 mg/dL Final   Creatinine, Ser  Date Value Ref Range Status  02/05/2021 0.73 0.57 - 1.00 mg/dL Final   Creatinine, POC  Date Value Ref Range Status  12/17/2016 200 mg/dL Final         Failed - K in normal range and within 180 days    Potassium  Date Value Ref Range Status  02/05/2021 4.0 3.5 - 5.2 mmol/L Final         Failed - Na in normal range and within 180 days    Sodium  Date Value Ref Range Status  02/05/2021 141 134 - 144 mmol/L Final         Passed - Last BP in normal range    BP Readings from Last 1 Encounters:  02/05/21 109/77         Passed - Valid encounter within last 6 months    Recent Outpatient Visits           3 weeks ago Obesity (BMI 35.0-39.9 without comorbidity)   [place], Vernia Buff, NP   9 months ago Essential hypertension   [place], Vernia Buff, NP   1 year ago Obesity (BMI 35.0-39.9 without comorbidity)   [place], Deborah B, MD   1 year ago Obesity (BMI 35.0-39.9 without comorbidity)   [place] Ladell Pier, MD   2 years ago Annual  physical exam   [place], MD       Future Appointments             Tomorrow Ladell Pier, MD [place]

## 2021-11-27 ENCOUNTER — Other Ambulatory Visit (HOSPITAL_COMMUNITY)
Admission: RE | Admit: 2021-11-27 | Discharge: 2021-11-27 | Disposition: A | Discharge status: Home / Self Care | Payer: 59 | Source: Ambulatory Visit | Attending: Internal Medicine | Admitting: Internal Medicine

## 2021-11-27 ENCOUNTER — Ambulatory Visit: Payer: 59 | Attending: Internal Medicine | Admitting: Internal Medicine

## 2021-11-27 ENCOUNTER — Encounter: Payer: Self-pay | Admitting: Internal Medicine

## 2021-11-27 VITALS — BP 116/87 | HR 92 | Temp 98.1°F | Resp 16 | Wt 219.4 lb

## 2021-11-27 DIAGNOSIS — Z6837 Body mass index (BMI) 37.0-37.9, adult: Secondary | ICD-10-CM

## 2021-11-27 DIAGNOSIS — Z124 Encounter for screening for malignant neoplasm of cervix: Secondary | ICD-10-CM | POA: Diagnosis not present

## 2021-11-27 DIAGNOSIS — Z1231 Encounter for screening mammogram for malignant neoplasm of breast: Secondary | ICD-10-CM

## 2021-11-27 DIAGNOSIS — Z780 Asymptomatic menopausal state: Secondary | ICD-10-CM

## 2021-11-27 DIAGNOSIS — E669 Obesity, unspecified: Secondary | ICD-10-CM

## 2021-11-27 DIAGNOSIS — Z1211 Encounter for screening for malignant neoplasm of colon: Secondary | ICD-10-CM

## 2021-11-27 MED ORDER — PHENTERMINE HCL 37.5 MG PO CAPS: 37.5000 mg | ORAL_CAPSULE | ORAL | 1 refills | Status: DC

## 2021-11-27 NOTE — Patient Instructions (Signed)
Menopause Menopause is the normal time of a woman's life when menstrual periods stop completely. It marks the natural end to a woman's ability to become pregnant. It can be defined as the absence of a menstrual period for 12 months without another medical cause. The transition to menopause (perimenopause) most often happens between the ages of 45 and 55, and can last for many years. During perimenopause, hormone levels change in your body, which can cause symptoms and affect your health. Menopause may increase your risk for: Weakened bones (osteoporosis), which causes fractures. Depression. Hardening and narrowing of the arteries (atherosclerosis), which can cause heart attacks and strokes. What are the causes? This condition is usually caused by a natural change in hormone levels that happens as you get older. The condition may also be caused by changes that are not natural, including: Surgery to remove both ovaries (surgical menopause). Side effects from some medicines, such as chemotherapy used to treat cancer (chemical menopause). What increases the risk? This condition is more likely to start at an earlier age if you have certain medical conditions or have undergone treatments, including: A tumor of the pituitary gland in the brain. A disease that affects the ovaries and hormones. Certain cancer treatments, such as chemotherapy or hormone therapy, or radiation therapy on the pelvis. Heavy smoking and excessive alcohol use. Family history of early menopause. This condition is also more likely to develop earlier in women who are very thin. What are the signs or symptoms? Symptoms of this condition include: Hot flashes. Irregular menstrual periods. Night sweats. Changes in feelings about sex. This could be a decrease in sex drive or an increased discomfort around your sexuality. Vaginal dryness and thinning of the vaginal walls. This may cause painful sex. Dryness of the skin and  development of wrinkles. Headaches. Problems sleeping (insomnia). Mood swings or irritability. Memory problems. Weight gain. Hair growth on the face and chest. Bladder infections or problems with urinating. How is this diagnosed? This condition is diagnosed based on your medical history, a physical exam, your age, your menstrual history, and your symptoms. Hormone tests may also be done. How is this treated? In some cases, no treatment is needed. You and your health care provider should make a decision together about whether treatment is necessary. Treatment will be based on your individual condition and preferences. Treatment for this condition focuses on managing symptoms. Treatment may include: Menopausal hormone therapy (MHT). Medicines to treat specific symptoms or complications. Acupuncture. Vitamin or herbal supplements. Before starting treatment, make sure to let your health care provider know if you have a personal or family history of these conditions: Heart disease. Breast cancer. Blood clots. Diabetes. Osteoporosis. Follow these instructions at home: Lifestyle Do not use any products that contain nicotine or tobacco, such as cigarettes, e-cigarettes, and chewing tobacco. If you need help quitting, ask your health care provider. Get at least 30 minutes of physical activity on 5 or more days each week. Avoid alcoholic and caffeinated beverages, as well as spicy foods. This may help prevent hot flashes. Get 7-8 hours of sleep each night. If you have hot flashes, try: Dressing in layers. Avoiding things that may trigger hot flashes, such as spicy food, warm places, or stress. Taking slow, deep breaths when a hot flash starts. Keeping a fan in your home and office. Find ways to manage stress, such as deep breathing, meditation, or journaling. Consider going to group therapy with other women who are having menopause symptoms. Ask your health care   provider about recommended  group therapy meetings. Eating and drinking  Eat a healthy, balanced diet that contains whole grains, lean protein, low-fat dairy, and plenty of fruits and vegetables. Your health care provider may recommend adding more soy to your diet. Foods that contain soy include tofu, tempeh, and soy milk. Eat plenty of foods that contain calcium and vitamin D for bone health. Items that are rich in calcium include low-fat milk, yogurt, beans, almonds, sardines, broccoli, and kale. Medicines Take over-the-counter and prescription medicines only as told by your health care provider. Talk with your health care provider before starting any herbal supplements. If prescribed, take vitamins and supplements as told by your health care provider. General instructions  Keep track of your menstrual periods, including: When they occur. How heavy they are and how long they last. How much time passes between periods. Keep track of your symptoms, noting when they start, how often you have them, and how long they last. Use vaginal lubricants or moisturizers to help with vaginal dryness and improve comfort during sex. Keep all follow-up visits. This is important. This includes any group therapy or counseling. Contact a health care provider if: You are still having menstrual periods after age 55. You have pain during sex. You have not had a period for 12 months and you develop vaginal bleeding. Get help right away if you have: Severe depression. Excessive vaginal bleeding. Pain when you urinate. A fast or irregular heartbeat (palpitations). Severe headaches. Abdominal pain or severe indigestion. Summary Menopause is a normal time of life when menstrual periods stop completely. It is usually defined as the absence of a menstrual period for 12 months without another medical cause. The transition to menopause (perimenopause) most often happens between the ages of 45 and 55 and can last for several years. Symptoms  can be managed through medicines, lifestyle changes, and complementary therapies such as acupuncture. Eat a balanced diet that is rich in nutrients to promote bone health and heart health and to manage symptoms during menopause. This information is not intended to replace advice given to you by your health care provider. Make sure you discuss any questions you have with your health care provider. Document Revised: 03/08/2020 Document Reviewed: 11/23/2019 Elsevier Patient Education  2023 Elsevier Inc.  

## 2021-11-27 NOTE — Progress Notes (Signed)
Patient ID: Erin Glenn, female    DOB: 17-Jun-1971  MRN: [id number]  CC: Gynecologic Exam   Subjective: Erin Glenn is a 51 y.o. female who presents for PAP Her concerns today include:  Patient with history of HTN, obesity, pre-menopausal, pre-DM, fibroids s/p ablation, vit D def.  I have not had a visit with her since 03/2020.  Patient states she was at [facility] but had insurance change again that is not accepted at [facility] so she decided to come back here.    GYN History:  Pt is G0P0 Any hx of abn paps?: never Menses regular or irregular?:  menopausal x 4 yrs How long does menses last? NA Menstrual flow light or heavy?: NA Method of birth control?:  NA Any vaginal dischg at this time?: no Dysuria?: no Any hx of STI?:  no Sexually active with how many partners:  her spouse only Desires STI screen:  no Last MMG:  10/2020.  She prefers to have a mammogram at [facility].  She has had 2 biopsies on the left breast.  Noncancerous. Family hx of uterine, cervical or breast cancer?:  no Endorses hot flashes. Reports her GYN with [facility] had connected her to a nutritionist and was going to get hormone inj to help with wgh.  However, she can no longer go there due to her insurance Wants to know if anything else to help with wgh.  what I do see on care everywhere was that she was being followed by the bariatric clinic at [facility].  She was on phentermine and Topamax.  Recently had visit with our NP and requested refill on phentermine.  Phentermine pills were sent.  However patient states that she does not tolerate the phentermine tablets, she has to have the capsules.  Reports doing well with her eating habits.  She is going to the gym 4 days a week at [employer].   HM:  due for colon cancer screen.  Prefers cologuard.  Wants to defer on Tdapt until after MMG  Patient Active Problem List   Diagnosis Date Noted   Dysuria 03/09/2019   Obesity (BMI 35.0-39.9  without comorbidity) 11/29/2018   Essential hypertension 07/13/2015   Fibroid uterus 10/11/2013     Current Outpatient Medications on File Prior to Visit  Medication Sig Dispense Refill   hydrochlorothiazide (HYDRODIURIL) 12.5 MG tablet TAKE 2 TABLETS(25 MG) BY MOUTH DAILY 60 tablet 0   meloxicam (MOBIC) 15 MG tablet TAKE 1 TABLET(15 MG) BY MOUTH DAILY 90 tablet 0   phentermine (ADIPEX-P) 37.5 MG tablet Take 1 tablet (37.5 mg total) by mouth daily before breakfast. 30 tablet 0   phentermine 30 MG capsule TAKE 1 CAPSULE(30 MG) BY MOUTH EVERY MORNING 30 capsule 1   No current facility-administered medications on file prior to visit.    Allergies  Allergen Reactions   Hydrocodone-Acetaminophen Nausea Only   Hydrocodone-Acetaminophen Nausea Only    Social History   Socioeconomic History   Marital status: Married    Spouse name: Not on file   Number of children: Not on file   Years of education: Not on file   Highest education level: Not on file  Occupational History   Not on file  Tobacco Use   Smoking status: Never   Smokeless tobacco: Never  Substance and Sexual Activity   Alcohol use: No   Drug use: No   Sexual activity: Yes    Birth control/protection: None    Comment: G0 P0  Other Topics Concern   Not on file  Social History Narrative   Not on file   Social Determinants of Health   Financial Resource Strain: Not on file  Food Insecurity: Not on file  Transportation Needs: Not on file  Physical Activity: Not on file  Stress: Not on file  Social Connections: Not on file  Intimate Partner Violence: Not on file    Family History  Problem Relation Age of Onset   Diabetic kidney disease Mother    Hypertension Mother    Stroke Mother    Heart disease Mother    Hyperlipidemia Mother    Diabetes Mother    Alcoholism Father    Hypertension Father    Cancer - Other Father 76       throat   Diabetes Father    Breast cancer Neg Hx     Past Surgical History:   Procedure Laterality Date   BREAST BIOPSY Left 09/2019   [place] bx, ribbon, ATYPICAL LYMPHOID PROLIFERATION   BREAST BIOPSY Left 12/14/2019   [place] bx, venus marker, path pending   DILATION AND CURETTAGE OF UTERUS  2019    ROS: Review of Systems Negative except as stated above  PHYSICAL EXAM: BP 116/87   Pulse 92   Temp 98.1 F (36.7 C) (Oral)   Resp 16   Wt 219 lb 6.4 oz (99.5 kg)   LMP 05/16/2016   SpO2 98%   BMI 37.66 kg/m   Wt Readings from Last 3 Encounters:  11/27/21 219 lb 6.4 oz (99.5 kg)  02/05/21 216 lb (98 kg)  10/06/19 221 lb 9.6 oz (100.5 kg)    Physical Exam  General appearance - alert, well appearing, obese middle-age African-American female and in no distress Breasts -CMA Dalbert [place] present for the breast and pelvic exam: Breasts appear normal, no suspicious masses, no skin or nipple changes or axillary nodes Pelvic - normal external genitalia, vulva, vagina, cervix, uterus and adnexa      Latest Ref Rng & Units 02/05/2021    3:08 PM 10/06/2019   11:29 AM 11/29/2018   11:00 AM  CMP  Glucose 65 - 99 mg/dL 84  86  88   BUN 6 - 24 mg/dL '10  8  8   '$ Creatinine 0.57 - 1.00 mg/dL 0.73  0.65  0.64   Sodium 134 - 144 mmol/L 141  140  138   Potassium 3.5 - 5.2 mmol/L 4.0  4.4  4.3   Chloride 96 - 106 mmol/L 104  102  104   CO2 20 - 29 mmol/L '21  24  23   '$ Calcium 8.7 - 10.2 mg/dL 9.7  9.6  9.5   Total Protein 6.0 - 8.5 g/dL 7.6  7.9  7.2   Total Bilirubin 0.0 - 1.2 mg/dL 0.2  0.3  0.3   Alkaline Phos 44 - 121 IU/L 80  84  84   AST 0 - 40 IU/L '19  19  14   '$ ALT 0 - 32 IU/L '16  13  11    '$ Lipid Panel     Component Value Date/Time   CHOL 169 02/05/2021 1508   TRIG 118 02/05/2021 1508   HDL 60 02/05/2021 1508   CHOLHDL 2.8 02/05/2021 1508   LDLCALC 88 02/05/2021 1508    CBC    Component Value Date/Time   WBC 7.7 02/05/2021 1508   WBC 8.6 07/09/2015 2118   RBC 4.92 02/05/2021 1508   RBC 4.81 07/09/2015 2118  HGB 13.6 02/05/2021 1508   HCT 40.3  02/05/2021 1508   PLT 283 02/05/2021 1508   MCV 82 02/05/2021 1508   MCH 27.6 02/05/2021 1508   MCH 25.3 (A) 07/09/2015 2118   MCHC 33.7 02/05/2021 1508   MCHC 33.8 07/09/2015 2118   RDW 14.5 02/05/2021 1508   LYMPHSABS 1.8 09/21/2017 1045   EOSABS 0.2 09/21/2017 1045   BASOSABS 0.0 09/21/2017 1045    ASSESSMENT AND PLAN: 1. Pap smear for cervical cancer screening - Cytology - PAP  2. Encounter for screening mammogram for malignant neoplasm of breast - MM Digital Screening; Future  3. Obesity (BMI 35.0-39.9 without comorbidity) Encouraged her to continue regular aerobic exercise. Discussed and encouraged her to continue healthy eating habits.  She is agreeable to referral to medical weight management.  I will send a prescription changing the phentermine to the capsule.  [place] controlled substance reporting system reviewed.  We will bring her back in 6 weeks to see how she is doing. - phentermine 37.5 MG capsule; Take 1 capsule (37.5 mg total) by mouth every morning.  Dispense: 30 capsule; Refill: 1 - Amb Ref to Medical Weight Management  4. Postmenopausal Discussed management of hot flashes.  She is not wanting hormone replacement therapy.  Discussed other options including use of gabapentin or low-dose Effexor.  Patient did not make a decision about trying 1 of these other agents.  Printed information given about menopause.  5. Screening for colon cancer - Cologuard    Patient was given the opportunity to ask questions.  Patient verbalized understanding of the plan and was able to repeat key elements of the plan.   This documentation was completed using Radio producer.  Any transcriptional errors are unintentional.  No orders of the defined types were placed in this encounter.    Requested Prescriptions    No prescriptions requested or ordered in this encounter    No follow-ups on file.  Karle Plumber, MD, FACP

## 2021-11-27 NOTE — Progress Notes (Signed)
Patient is here for her pap. Patient has no other concerns for provider.

## 2021-11-28 LAB — CYTOLOGY - PAP
Comment: NEGATIVE
Diagnosis: NEGATIVE
High risk HPV: NEGATIVE

## 2021-12-26 ENCOUNTER — Other Ambulatory Visit: Payer: Self-pay | Admitting: Internal Medicine

## 2021-12-26 DIAGNOSIS — I10 Essential (primary) hypertension: Secondary | ICD-10-CM

## 2021-12-26 NOTE — Telephone Encounter (Signed)
Refilled until upcoming appt Requested Prescriptions  Pending Prescriptions Disp Refills  . hydrochlorothiazide (HYDRODIURIL) 12.5 MG tablet [Pharmacy Med Name: HYDROCHLOROTHIAZIDE 12.'5MG'$  TABLETS] 30 tablet 0    Sig: TAKE 2 TABLETS(25 MG) BY MOUTH DAILY     Cardiovascular: Diuretics - Thiazide Failed - 12/26/2021  6:34 AM      Failed - Cr in normal range and within 180 days    Creat  Date Value Ref Range Status  07/09/2015 0.65 0.50 - 1.10 mg/dL Final   Creatinine, Ser  Date Value Ref Range Status  02/05/2021 0.73 0.57 - 1.00 mg/dL Final   Creatinine, POC  Date Value Ref Range Status  12/17/2016 200 mg/dL Final         Failed - K in normal range and within 180 days    Potassium  Date Value Ref Range Status  02/05/2021 4.0 3.5 - 5.2 mmol/L Final         Failed - Na in normal range and within 180 days    Sodium  Date Value Ref Range Status  02/05/2021 141 134 - 144 mmol/L Final         Passed - Last BP in normal range    BP Readings from Last 1 Encounters:  11/27/21 116/87         Passed - Valid encounter within last 6 months    Recent Outpatient Visits          4 weeks ago Pap smear for cervical cancer screening   [place], MD   1 month ago Obesity (BMI 35.0-39.9 without comorbidity)   [place] Gildardo Pounds, NP   10 months ago Essential hypertension   [place], Vernia Buff, NP   1 year ago Obesity (BMI 35.0-39.9 without comorbidity)   [place], Deborah B, MD   1 year ago Obesity (BMI 35.0-39.9 without comorbidity)   [place], Deborah B, MD      Future Appointments            In 2 weeks Ladell Pier, MD [place]

## 2022-01-09 ENCOUNTER — Ambulatory Visit: Payer: 59 | Admitting: Internal Medicine

## 2022-01-31 ENCOUNTER — Telehealth: Payer: Self-pay | Admitting: Internal Medicine

## 2022-01-31 DIAGNOSIS — I10 Essential (primary) hypertension: Secondary | ICD-10-CM

## 2022-02-02 NOTE — Telephone Encounter (Signed)
Requested medication (s) are due for refill today: Yes  Requested medication (s) are on the active medication list: Yes  Last refill:  12/26/21  Future visit scheduled: No, No Show last appointment.  Notes to clinic:  Protocol indicates lab work is needed.    Requested Prescriptions  Pending Prescriptions Disp Refills   hydrochlorothiazide (HYDRODIURIL) 12.5 MG tablet [Pharmacy Med Name: HYDROCHLOROTHIAZIDE 12.'5MG'$  TABLETS] 30 tablet 0    Sig: TAKE 2 TABLETS(25 MG) BY MOUTH DAILY     Cardiovascular: Diuretics - Thiazide Failed - 01/31/2022  9:34 AM      Failed - Cr in normal range and within 180 days    Creat  Date Value Ref Range Status  07/09/2015 0.65 0.50 - 1.10 mg/dL Final   Creatinine, Ser  Date Value Ref Range Status  02/05/2021 0.73 0.57 - 1.00 mg/dL Final   Creatinine, POC  Date Value Ref Range Status  12/17/2016 200 mg/dL Final         Failed - K in normal range and within 180 days    Potassium  Date Value Ref Range Status  02/05/2021 4.0 3.5 - 5.2 mmol/L Final         Failed - Na in normal range and within 180 days    Sodium  Date Value Ref Range Status  02/05/2021 141 134 - 144 mmol/L Final         Passed - Last BP in normal range    BP Readings from Last 1 Encounters:  11/27/21 116/87         Passed - Valid encounter within last 6 months    Recent Outpatient Visits           2 months ago Pap smear for cervical cancer screening   [place], MD   3 months ago Obesity (BMI 35.0-39.9 without comorbidity)   [place] Gildardo Pounds, NP   12 months ago Essential hypertension   [place], Vernia Buff, NP   1 year ago Obesity (BMI 35.0-39.9 without comorbidity)   [place] Ladell Pier, MD   2 years ago Obesity (BMI 35.0-39.9 without comorbidity)   [facility]  Ladell Pier, MD

## 2022-02-03 ENCOUNTER — Other Ambulatory Visit: Payer: Self-pay | Admitting: Pharmacist

## 2022-02-03 ENCOUNTER — Other Ambulatory Visit: Payer: Self-pay | Admitting: Internal Medicine

## 2022-02-03 DIAGNOSIS — I10 Essential (primary) hypertension: Secondary | ICD-10-CM

## 2022-02-03 MED ORDER — HYDROCHLOROTHIAZIDE 12.5 MG PO TABS: 25.0000 mg | ORAL_TABLET | Freq: Every day | ORAL | 0 refills | Status: DC

## 2022-02-03 NOTE — Telephone Encounter (Signed)
Pt states that she does not understand why she has to come to the office to get her blood and blood pressure checked. I explained to patient that she has to have routine follow ups with her PCP to continue to get medication refills. Patient became upset and hung up the phone.

## 2022-02-03 NOTE — Telephone Encounter (Addendum)
Patient called, left VM to return the call to the office to discuss her medication refill. If patient returns the call, she will need a follow up appointment scheduled since the one in July was a no show.

## 2022-02-03 NOTE — Telephone Encounter (Signed)
Medication was initially refused due to patient needing an appointment and no-showing her July appointment. I sent over a month's supply to hold her over until she can be seen.   Will forward information to our office administrator.

## 2022-02-03 NOTE — Telephone Encounter (Signed)
Patient called in very upset that her blood pressure medication refill was denied. Patient states she was just seen in June for a physical and her appointment that she missed in July was unrelated to her blood pressure medication. Patient states she wants to report the person that declined the refill request. Please have practice administrator follow up with patient.

## 2022-02-03 NOTE — Telephone Encounter (Signed)
Requested medication (s) are due for refill today: Yes  Requested medication (s) are on the active medication list: Yes  Last refill:  12/26/21 (Courtesy refill)  Future visit scheduled: No  Notes to clinic:  Unable to refill per protocol, appointment needed, updated labs needed.     Requested Prescriptions  Pending Prescriptions Disp Refills   hydrochlorothiazide (HYDRODIURIL) 12.5 MG tablet [Pharmacy Med Name: HYDROCHLOROTHIAZIDE 12.'5MG'$  TABLETS] 30 tablet 0    Sig: TAKE 2 TABLETS(25 MG) BY MOUTH DAILY     Cardiovascular: Diuretics - Thiazide Failed - 02/03/2022  9:48 AM      Failed - Cr in normal range and within 180 days    Creat  Date Value Ref Range Status  07/09/2015 0.65 0.50 - 1.10 mg/dL Final   Creatinine, Ser  Date Value Ref Range Status  02/05/2021 0.73 0.57 - 1.00 mg/dL Final   Creatinine, POC  Date Value Ref Range Status  12/17/2016 200 mg/dL Final         Failed - K in normal range and within 180 days    Potassium  Date Value Ref Range Status  02/05/2021 4.0 3.5 - 5.2 mmol/L Final         Failed - Na in normal range and within 180 days    Sodium  Date Value Ref Range Status  02/05/2021 141 134 - 144 mmol/L Final         Passed - Last BP in normal range    BP Readings from Last 1 Encounters:  11/27/21 116/87         Passed - Valid encounter within last 6 months    Recent Outpatient Visits           2 months ago Pap smear for cervical cancer screening   [place], MD   3 months ago Obesity (BMI 35.0-39.9 without comorbidity)   [place] Gildardo Pounds, NP   12 months ago Essential hypertension   [place], Vernia Buff, NP   1 year ago Obesity (BMI 35.0-39.9 without comorbidity)   [place] Ladell Pier, MD   2 years ago Obesity (BMI 35.0-39.9 without comorbidity)   [facility] Ladell Pier, MD

## 2022-02-03 NOTE — Telephone Encounter (Signed)
Pt called back, advised her of Lurena Joiner, [facility] sent in 30 DS of hctz that pt would need to schedule appt for fu on HTN. Pt went into saying she didn't have BP problems and she has to go thru this every 30 days to get medication and at first was talking about weighing so I thought maybe she was talking about phenteremine but she said she went to [facility] d/t insurance changes and had phsycial there and updated lab work and provided office with this when she schedule appt back with Dr. Wynetta Emery. Pt kept arguing and made the comment she can file a lawsuit today d/t falsifying records since they were in her mychart. I advised her I would get her in contact with someone who could provide her with better information. Reached out to [place], [facility] and asked if she could speak with pt. Transferred pt successfully.

## 2022-02-03 NOTE — Telephone Encounter (Signed)
Pt returned the call yelling and very upset stated she is filling a complaint with the state. 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 Pt stated her BP medication was denied pt asked me multiple times. What department is speaking to the pharmacist and giving the pharmacist wrong information. I tried to explain that I don't have that information because I am not sure what she is referring too.   Pt is requesting a call back from the office administrator. Pt was provided with Patient Experience contact information.

## 2022-03-13 ENCOUNTER — Other Ambulatory Visit: Payer: Self-pay | Admitting: Pharmacist

## 2022-03-13 ENCOUNTER — Ambulatory Visit: Payer: Self-pay

## 2022-03-13 DIAGNOSIS — I10 Essential (primary) hypertension: Secondary | ICD-10-CM

## 2022-03-13 MED ORDER — HYDROCHLOROTHIAZIDE 12.5 MG PO TABS: 25.0000 mg | ORAL_TABLET | Freq: Every day | ORAL | 0 refills | Status: DC

## 2022-03-13 NOTE — Telephone Encounter (Signed)
   Chief Complaint: Missing medication Symptoms:  Frequency:  Pertinent Negatives: Patient denies  Disposition: '[]'$ ED /'[]'$ Urgent Care (no appt availability in office) / '[]'$ Appointment(In office/virtual)/ '[]'$  [place] [facility]/ '[]'$ Home Care/ '[]'$ Refused Recommended Disposition /'[]'$ [place] [facility]/ '[x]'$  Follow-up with PCP Additional Notes: Pt states that her refill of HCTZ was declined d/t missing expired labs. Pt states that labs were recently done at "[facility]". PT will fax over lab results to the office. Pt would like medication refilled after review by provider.  Reason for Disposition  [1] Prescription refill request for ESSENTIAL medicine (i.e., likelihood of harm to patient if not taken) AND [2] triager unable to refill per department policy  Answer Assessment - Initial Assessment Questions 1. DRUG NAME: "What medicine do you need to have refilled?"     HCTZ 2. REFILLS REMAINING: "How many refills are remaining?" (Note: The label on the medicine or pill bottle will show how many refills are remaining. If there are no refills remaining, then a renewal may be needed.)     0 3. EXPIRATION DATE: "What is the expiration date?" (Note: The label states when the prescription will expire, and thus can no longer be refilled.)      4. PRESCRIBING HCP: "Who prescribed it?" Reason: If prescribed by specialist, call should be referred to that group.     Dr. Wynetta Emery 5. SYMPTOMS: "Do you have any symptoms?"      6. PREGNANCY: "Is there any chance that you are pregnant?" "When was your last menstrual period?"  Protocols used: Medication Refill and Renewal Call-A-AH

## 2022-03-16 ENCOUNTER — Ambulatory Visit: Payer: Medicaid Other | Attending: Nurse Practitioner | Admitting: Nurse Practitioner

## 2022-04-14 ENCOUNTER — Encounter (INDEPENDENT_AMBULATORY_CARE_PROVIDER_SITE_OTHER): Payer: BLUE CROSS/BLUE SHIELD | Admitting: Internal Medicine

## 2022-04-20 ENCOUNTER — Other Ambulatory Visit: Payer: Self-pay | Admitting: Internal Medicine

## 2022-04-20 DIAGNOSIS — I10 Essential (primary) hypertension: Secondary | ICD-10-CM

## 2022-04-20 NOTE — Telephone Encounter (Signed)
Medication Refill - Medication: hydrochlorothiazide (HYDRODIURIL) 12.5 MG tablet  Has the patient contacted their pharmacy? Yes.   Call office Pt has appt 11/30.  Can you send 30 days to get her through to appt?  Preferred Pharmacy (with phone number or street name): [id number] - [place], [address] [place] Has the patient been seen for an appointment in the last year OR does the patient have an upcoming appointment? Yes.    Agent: Please be advised that RX refills may take up to 3 business days. We ask that you follow-up with your pharmacy.

## 2022-04-21 ENCOUNTER — Other Ambulatory Visit: Payer: Self-pay | Admitting: Internal Medicine

## 2022-04-21 DIAGNOSIS — I10 Essential (primary) hypertension: Secondary | ICD-10-CM

## 2022-04-21 NOTE — Telephone Encounter (Signed)
reordered 04/21/22 by Tresa Endo RPH-CPP  Requested Prescriptions  Refused Prescriptions Disp Refills  . hydrochlorothiazide (HYDRODIURIL) 12.5 MG tablet 60 tablet 0    Sig: Take 2 tablets (25 mg total) by mouth daily.     Cardiovascular: Diuretics - Thiazide Failed - 04/20/2022  3:25 PM      Failed - Cr in normal range and within 180 days    Creat  Date Value Ref Range Status  07/09/2015 0.65 0.50 - 1.10 mg/dL Final   Creatinine, Ser  Date Value Ref Range Status  02/05/2021 0.73 0.57 - 1.00 mg/dL Final   Creatinine, POC  Date Value Ref Range Status  12/17/2016 200 mg/dL Final         Failed - K in normal range and within 180 days    Potassium  Date Value Ref Range Status  02/05/2021 4.0 3.5 - 5.2 mmol/L Final         Failed - Na in normal range and within 180 days    Sodium  Date Value Ref Range Status  02/05/2021 141 134 - 144 mmol/L Final         Passed - Last BP in normal range    BP Readings from Last 1 Encounters:  11/27/21 116/87         Passed - Valid encounter within last 6 months    Recent Outpatient Visits          4 months ago Pap smear for cervical cancer screening   [place], MD   5 months ago Obesity (BMI 35.0-39.9 without comorbidity)   [place], Zelda W, NP   1 year ago Essential hypertension   [place], Vernia Buff, NP   2 years ago Obesity (BMI 35.0-39.9 without comorbidity)   [place], Deborah B, MD   2 years ago Obesity (BMI 35.0-39.9 without comorbidity)   [place], Deborah B, MD      Future Appointments            In 1 month [place], Dionne Bucy, PA-C [place]

## 2022-05-12 ENCOUNTER — Ambulatory Visit
Admission: RE | Admit: 2022-05-12 | Discharge: 2022-05-12 | Disposition: A | Discharge status: Home / Self Care | Payer: Commercial Managed Care - HMO | Source: Ambulatory Visit | Attending: Internal Medicine | Admitting: Internal Medicine

## 2022-05-12 DIAGNOSIS — Z1231 Encounter for screening mammogram for malignant neoplasm of breast: Secondary | ICD-10-CM | POA: Insufficient documentation

## 2022-05-21 ENCOUNTER — Ambulatory Visit: Payer: Commercial Managed Care - HMO | Attending: Physician Assistant | Admitting: Physician Assistant

## 2022-05-21 ENCOUNTER — Encounter: Payer: Self-pay | Admitting: Physician Assistant

## 2022-05-21 VITALS — BP 135/89 | HR 81 | Wt 219.6 lb

## 2022-05-21 DIAGNOSIS — I10 Essential (primary) hypertension: Secondary | ICD-10-CM

## 2022-05-21 DIAGNOSIS — E559 Vitamin D deficiency, unspecified: Secondary | ICD-10-CM | POA: Diagnosis not present

## 2022-05-21 DIAGNOSIS — Z6837 Body mass index (BMI) 37.0-37.9, adult: Secondary | ICD-10-CM

## 2022-05-21 DIAGNOSIS — R52 Pain, unspecified: Secondary | ICD-10-CM | POA: Diagnosis not present

## 2022-05-21 DIAGNOSIS — E669 Obesity, unspecified: Secondary | ICD-10-CM | POA: Diagnosis not present

## 2022-05-21 MED ORDER — HYDROCHLOROTHIAZIDE 12.5 MG PO TABS: ORAL_TABLET | ORAL | 1 refills | Status: DC

## 2022-05-21 MED ORDER — PHENTERMINE HCL 37.5 MG PO CAPS: 37.5000 mg | ORAL_CAPSULE | ORAL | 1 refills | Status: DC

## 2022-05-21 MED ORDER — MELOXICAM 15 MG PO TABS: ORAL_TABLET | ORAL | 0 refills | Status: DC

## 2022-05-21 NOTE — Progress Notes (Signed)
Patient ID: Erin Glenn, female   DOB: 07-05-1970, 51 y.o.   MRN: [id number]    Erin Glenn, is a 51 y.o. female  [id number]  [id number]  DOB - Nov 12, 1970  Chief Complaint  Patient presents with   Labs Only       Subjective:   Erin Glenn is a 51 y.o. female here today for a med Rf.  She has with her on her phone labs that were drawn elsewhere in September.  Labs reviewed and pertinent levels recorded below.  She has been working on diet and exercise and has reversed a prediabetes state.  She would like to resume phentermine to jump start weight loss again bc she feels she has plateau-ed.  She denies dizziness/CP/SOB.  No new issues or concerns.  From 02/27/2022 A1C=5.3 K+=3.6, Cr=0.67 Cholesterol=178 TSH=WNL Hgb=12.2  No problems updated.  ALLERGIES: Allergies  Allergen Reactions   Hydrocodone-Acetaminophen Nausea Only   Hydrocodone-Acetaminophen Nausea Only    PAST MEDICAL HISTORY: Past Medical History:  Diagnosis Date   Fibroid uterus 2014   Unspecified vitamin D deficiency 09/26/2013   urgent care at [place]: Prior to Admission medications   Medication Sig Start Date End Date Taking? Authorizing Provider  hydrochlorothiazide (HYDRODIURIL) 12.5 MG tablet TAKE 2 TABLETS(25 MG) BY MOUTH DAILY 05/21/22   Argentina Donovan, PA-C  meloxicam (MOBIC) 15 MG tablet TAKE 1 TABLET(15 MG) BY MOUTH DAILY prn pain 05/21/22   Argentina Donovan, PA-C  phentermine 37.5 MG capsule Take 1 capsule (37.5 mg total) by mouth every morning. 05/21/22   Skylen Spiering, Dionne Bucy, PA-C    ROS: Neg HEENT Neg resp Neg cardiac Neg GI Neg GU Neg MS Neg psych Neg neuro  Objective:   Vitals:   05/21/22 1630 05/21/22 1632  BP: 124/84 135/89  Pulse:  81  SpO2:  99%  Weight:  219 lb 9.6 oz (99.6 kg)   Exam General appearance : Awake, alert, not in any distress. Speech Clear. Not toxic looking HEENT: Atraumatic and Normocephalic Neck:  Supple, no JVD. No cervical lymphadenopathy.  Chest: Good air entry bilaterally, CTAB.  No rales/rhonchi/wheezing CVS: S1 S2 regular, no murmurs.  Extremities: B/L Lower Ext shows no edema, both legs are warm to touch Neurology: Awake alert, and oriented X 3, CN II-XII intact, Non focal Skin: No Rash  Data Review Lab Results  Component Value Date   HGBA1C 5.9 (H) 02/05/2021   HGBA1C 5.5 10/06/2019   HGBA1C 5.9 12/17/2016    Assessment & Plan   1. Essential hypertension Controlled; labs stable and reviewed/recorded above - hydrochlorothiazide (HYDRODIURIL) 12.5 MG tablet; TAKE 2 TABLETS(25 MG) BY MOUTH DAILY  Dispense: 180 tablet; Refill: 1  2. Obesity (BMI 35.0-39.9 without comorbidity) Continue healthier diet and exercise - phentermine 37.5 MG capsule; Take 1 capsule (37.5 mg total) by mouth every morning.  Dispense: 30 capsule; Refill: 1  3. Vitamin D deficiency disease Take 2000 units vitamin D daily  4. Body aches She uses this for various aches and pains and it works well prn - meloxicam (MOBIC) 15 MG tablet; TAKE 1 TABLET(15 MG) BY MOUTH DAILY prn pain  Dispense: 90 tablet; Refill: 0    Return in about 2 months (around 07/21/2022) for with me or Dr Ezekiel Slocumb for weight/meds.  The patient was given clear instructions to go to ER or return to medical center if symptoms don't improve, worsen or new problems develop. The patient verbalized understanding. The patient was told to  call to get lab results if they haven't heard anything in the next week.      Freeman Caldron, PA-C [facility], [place]   05/22/2022, 10:21 AM

## 2022-05-22 ENCOUNTER — Encounter: Payer: Self-pay | Admitting: Physician Assistant

## 2022-07-01 ENCOUNTER — Ambulatory Visit: Payer: Commercial Managed Care - HMO | Admitting: Physician Assistant

## 2022-07-22 ENCOUNTER — Ambulatory Visit: Payer: Commercial Managed Care - HMO | Admitting: Physician Assistant

## 2022-08-13 ENCOUNTER — Other Ambulatory Visit: Payer: Self-pay | Admitting: Physician Assistant

## 2022-08-13 DIAGNOSIS — E669 Obesity, unspecified: Secondary | ICD-10-CM

## 2022-08-13 NOTE — Telephone Encounter (Signed)
Requested medications are due for refill today.  Provider to determine  Requested medications are on the active medications list.  yes  Last refill. 05/21/2022 #30 1 rf  Future visit scheduled.   no  Notes to clinic.  Refill not delegated.    Requested Prescriptions  Pending Prescriptions Disp Refills   phentermine 37.5 MG capsule [Pharmacy Med Name: PHENTERMINE 37.5MG CAPSULES] 30 capsule     Sig: TAKE 1 CAPSULE(37.5 MG) BY MOUTH EVERY MORNING     Not Delegated - Neurology: Anticonvulsants - Controlled - phentermine hydrochloride Failed - 08/13/2022  7:18 AM      Failed - This refill cannot be delegated      Failed - eGFR in normal range and within 360 days    GFR, Est African American  Date Value Ref Range Status  07/09/2015 >89 >=60 mL/min Final   GFR calc Af Amer  Date Value Ref Range Status  10/06/2019 123 >59 mL/min/1.73 Final   GFR, Est Non African American  Date Value Ref Range Status  07/09/2015 >89 >=60 mL/min Final    Comment:      The estimated GFR is a calculation valid for adults (>=25 years old) that uses the CKD-EPI algorithm to adjust for age and sex. It is   not to be used for children, pregnant women, hospitalized patients,    patients on dialysis, or with rapidly changing kidney function. According to the NKDEP, eGFR >89 is normal, 60-89 shows mild impairment, 30-59 shows moderate impairment, 15-29 shows severe impairment and <15 is ESRD.      GFR calc non Af Amer  Date Value Ref Range Status  10/06/2019 107 >59 mL/min/1.73 Final   eGFR  Date Value Ref Range Status  02/05/2021 102 >59 mL/min/1.73 Final         Failed - Cr in normal range and within 360 days    Creat  Date Value Ref Range Status  07/09/2015 0.65 0.50 - 1.10 mg/dL Final   Creatinine, Ser  Date Value Ref Range Status  02/05/2021 0.73 0.57 - 1.00 mg/dL Final   Creatinine, POC  Date Value Ref Range Status  12/17/2016 200 mg/dL Final         Passed - Last BP in normal  range    BP Readings from Last 1 Encounters:  05/21/22 135/89         Passed - Valid encounter within last 6 months    Recent Outpatient Visits           2 months ago Vitamin D deficiency disease   [place], [place], [place]   8 months ago Pap smear for cervical cancer screening   [place] Karle Plumber B, MD   9 months ago Obesity (BMI 35.0-39.9 without comorbidity)   [place] Gildardo Pounds, NP   1 year ago Essential hypertension   [place], [place] W, NP   2 years ago Obesity (BMI 35.0-39.9 without comorbidity)   [place] Ladell Pier, MD              Passed - Weight completed in the last 3 months    Wt Readings from Last 1 Encounters:  05/21/22 219 lb 9.6 oz (99.6 kg)

## 2022-09-09 NOTE — Progress Notes (Deleted)
Patient ID: Erin Glenn, female    DOB: [birth year]  MRN: [id number]  CC: Chronic Care Management   Subjective: Erin Glenn is a 52 y.o. female who presents for chronic care management.   Her concerns today include:  Last appt 05/21/2022 with Thereasa Solo, PA: 1. Essential hypertension Controlled; labs stable and reviewed/recorded above - hydrochlorothiazide (HYDRODIURIL) 12.5 MG tablet; TAKE 2 TABLETS(25 MG) BY MOUTH DAILY  Dispense: 180 tablet; Refill: 1  Today's visit 09/10/2022:   Patient Active Problem List   Diagnosis Date Noted   Dysuria 03/09/2019   Obesity (BMI 35.0-39.9 without comorbidity) 11/29/2018   Essential hypertension 07/13/2015   Fibroid uterus 10/11/2013     Current Outpatient Medications on File Prior to Visit  Medication Sig Dispense Refill   hydrochlorothiazide (HYDRODIURIL) 12.5 MG tablet TAKE 2 TABLETS(25 MG) BY MOUTH DAILY 180 tablet 1   meloxicam (MOBIC) 15 MG tablet TAKE 1 TABLET(15 MG) BY MOUTH DAILY prn pain 90 tablet 0   phentermine 37.5 MG capsule TAKE 1 CAPSULE(37.5 MG) BY MOUTH EVERY MORNING 30 capsule 1   No current facility-administered medications on file prior to visit.    Allergies  Allergen Reactions   Hydrocodone-Acetaminophen Nausea Only   Hydrocodone-Acetaminophen Nausea Only    Social History   Socioeconomic History   Marital status: Married    Spouse name: Not on file   Number of children: Not on file   Years of education: Not on file   Highest education level: Not on file  Occupational History   Not on file  Tobacco Use   Smoking status: Never   Smokeless tobacco: Never  Substance and Sexual Activity   Alcohol use: No   Drug use: No   Sexual activity: Yes    Birth control/protection: None    Comment: G0 P0  Other Topics Concern   Not on file  Social History Narrative   Not on file   Social Determinants of Health   Financial Resource Strain: Not on file  Food Insecurity: Not on file  Transportation  Needs: Not on file  Physical Activity: Not on file  Stress: Not on file  Social Connections: Not on file  Intimate Partner Violence: Not on file    Family History  Problem Relation Age of Onset   Diabetic kidney disease Mother    Hypertension Mother    Stroke Mother    Heart disease Mother    Hyperlipidemia Mother    Diabetes Mother    Alcoholism Father    Hypertension Father    Cancer - Other Father 49       throat   Diabetes Father    Breast cancer Neg Hx     Past Surgical History:  Procedure Laterality Date   BREAST BIOPSY Left 09/2019   [place] bx, ribbon, ATYPICAL LYMPHOID PROLIFERATION   BREAST BIOPSY Left 12/14/2019   [place] bx, venus marker, path pending   DILATION AND CURETTAGE OF UTERUS  2019    ROS: Review of Systems Negative except as stated above  PHYSICAL EXAM: LMP 05/16/2016   Physical Exam  {female adult master:310786} {female adult master:310785}     Latest Ref Rng & Units 02/05/2021    3:08 PM 10/06/2019   11:29 AM 11/29/2018   11:00 AM  CMP  Glucose 65 - 99 mg/dL 84  86  88   BUN 6 - 24 mg/dL 10  8  8    Creatinine 0.57 - 1.00 mg/dL 0.73  0.65  0.64  Sodium 134 - 144 mmol/L 141  140  138   Potassium 3.5 - 5.2 mmol/L 4.0  4.4  4.3   Chloride 96 - 106 mmol/L 104  102  104   CO2 20 - 29 mmol/L 21  24  23    Calcium 8.7 - 10.2 mg/dL 9.7  9.6  9.5   Total Protein 6.0 - 8.5 g/dL 7.6  7.9  7.2   Total Bilirubin 0.0 - 1.2 mg/dL 0.2  0.3  0.3   Alkaline Phos 44 - 121 IU/L 80  84  84   AST 0 - 40 IU/L 19  19  14    ALT 0 - 32 IU/L 16  13  11     Lipid Panel     Component Value Date/Time   CHOL 169 02/05/2021 1508   TRIG 118 02/05/2021 1508   HDL 60 02/05/2021 1508   CHOLHDL 2.8 02/05/2021 1508   LDLCALC 88 02/05/2021 1508    CBC    Component Value Date/Time   WBC 7.7 02/05/2021 1508   WBC 8.6 07/09/2015 2118   RBC 4.92 02/05/2021 1508   RBC 4.81 07/09/2015 2118   HGB 13.6 02/05/2021 1508   HCT 40.3 02/05/2021 1508   PLT 283 02/05/2021 1508    MCV 82 02/05/2021 1508   MCH 27.6 02/05/2021 1508   MCH 25.3 (A) 07/09/2015 2118   MCHC 33.7 02/05/2021 1508   MCHC 33.8 07/09/2015 2118   RDW 14.5 02/05/2021 1508   LYMPHSABS 1.8 09/21/2017 1045   EOSABS 0.2 09/21/2017 1045   BASOSABS 0.0 09/21/2017 1045    ASSESSMENT AND PLAN:  There are no diagnoses linked to this encounter.   Patient was given the opportunity to ask questions.  Patient verbalized understanding of the plan and was able to repeat key elements of the plan. Patient was given clear instructions to go to Emergency Department or return to medical center if symptoms don't improve, worsen, or new problems develop.The patient verbalized understanding.   No orders of the defined types were placed in this encounter.    Requested Prescriptions    No prescriptions requested or ordered in this encounter    No follow-ups on file.  Camillia Herter, NP

## 2022-09-10 ENCOUNTER — Ambulatory Visit: Payer: Commercial Managed Care - HMO | Admitting: Family

## 2022-09-10 DIAGNOSIS — I10 Essential (primary) hypertension: Secondary | ICD-10-CM

## 2022-09-12 ENCOUNTER — Encounter: Payer: Self-pay | Admitting: Family

## 2022-09-12 ENCOUNTER — Other Ambulatory Visit: Payer: Self-pay | Admitting: Internal Medicine

## 2022-09-12 ENCOUNTER — Ambulatory Visit: Payer: Commercial Managed Care - HMO | Attending: Family | Admitting: Family

## 2022-09-12 VITALS — BP 124/88 | HR 75 | Resp 16 | Wt 214.0 lb

## 2022-09-12 DIAGNOSIS — E669 Obesity, unspecified: Secondary | ICD-10-CM

## 2022-09-12 DIAGNOSIS — I10 Essential (primary) hypertension: Secondary | ICD-10-CM | POA: Diagnosis not present

## 2022-09-12 DIAGNOSIS — Z6836 Body mass index (BMI) 36.0-36.9, adult: Secondary | ICD-10-CM | POA: Diagnosis not present

## 2022-09-12 NOTE — Progress Notes (Signed)
Patient has been counseled on age-appropriate routine health concerns for screening and prevention. These are reviewed and up-to-date. Referrals have been placed accordingly. Immunizations are up-to-date or declined.    Subjective:   Chief Complaint  Patient presents with   Medication Refill    Pt is requesting a refill on phentermine    Patient is a 52 year old female who is here this morning to follow up for weight management and medication (Phentermine) refill. Patient reports she started taking Phentermine 37.5 mg about 3 months ago, prescribed by her PCP, since then she has been losing weight. Patient believes she lost about 30 pounds since starting the medication. She reports no side effects to the medication. She does not take the medication every, only as needed. For hypertension, patient takes HCTZ 12.5 mg two tablet by mouth once per day. Denies CP, SOB, anxieties, fever, palpitations or any other symptoms today. Patient reports she has joined a gym and is expecting to lose more weight.     Past Medical History:  Diagnosis Date   Fibroid uterus 2014   Unspecified vitamin D deficiency 09/26/2013   urgent care at [facility]    Past Surgical History:  Procedure Laterality Date   BREAST BIOPSY Left 09/2019   [place] bx, ribbon, ATYPICAL LYMPHOID PROLIFERATION   BREAST BIOPSY Left 12/14/2019   [place] bx, venus marker, path pending   DILATION AND CURETTAGE OF UTERUS  2019    Family History  Problem Relation Age of Onset   Diabetic kidney disease Mother    Hypertension Mother    Stroke Mother    Heart disease Mother    Hyperlipidemia Mother    Diabetes Mother    Alcoholism Father    Hypertension Father    Cancer - Other Father 61       throat   Diabetes Father    Breast cancer Neg Hx     Social History Reviewed with no changes to be made today.   Outpatient Medications Prior to Visit  Medication Sig Dispense Refill   hydrochlorothiazide (HYDRODIURIL) 12.5 MG tablet  TAKE 2 TABLETS(25 MG) BY MOUTH DAILY 180 tablet 1   meloxicam (MOBIC) 15 MG tablet TAKE 1 TABLET(15 MG) BY MOUTH DAILY prn pain 90 tablet 0   phentermine 37.5 MG capsule TAKE 1 CAPSULE(37.5 MG) BY MOUTH EVERY MORNING 30 capsule 1   No facility-administered medications prior to visit.    Allergies  Allergen Reactions   Hydrocodone-Acetaminophen Nausea Only   Hydrocodone-Acetaminophen Nausea Only       Objective:    BP 124/88   Pulse 75   Resp 16   Wt 214 lb (97.1 kg)   LMP 05/16/2016   SpO2 96%   BMI 36.73 kg/m  Wt Readings from Last 3 Encounters:  09/12/22 214 lb (97.1 kg)  05/21/22 219 lb 9.6 oz (99.6 kg)  11/27/21 219 lb 6.4 oz (99.5 kg)    Physical Exam Constitutional:      Appearance: Normal appearance.  Cardiovascular:     Rate and Rhythm: Normal rate and regular rhythm.  Pulmonary:     Effort: Pulmonary effort is normal.     Breath sounds: Normal breath sounds.  Abdominal:     General: Abdomen is flat.     Palpations: Abdomen is soft.  Musculoskeletal:        General: Normal range of motion.     Cervical back: Normal range of motion.  Neurological:     General: No focal deficit  present.     Mental Status: She is alert and oriented to person, place, and time.     Assessment & Plan:  Patient is a 52 year old female who is here for follow up for weight loss management and hypertension. She is taking Phentermine as needed for weight loss with good results. Patient is a great candidate for GLP-1 treatments instead. Reminded to discuss the treatment with her PCP during next visit.  PLAN:  1) Obesity:  - Increase physical activities and eat plant based diet  - Phentermine refill (requested to PCP)  - Report new symptoms to the clinic or local ED  - Follow up in 3 months with PCP  2) Essential Hypertension:  - Continue taking blood pressure medications as ordered  - Increase physical activities  - Report new symptoms to the clinic or local ED   Patient  has been counseled extensively about nutrition and exercise as well as the importance of adherence with medications and regular follow-up. The patient was given clear instructions to go to ER or return to medical center if symptoms don't improve, worsen or new problems develop. The patient verbalized understanding.    Follow-up: Return in about 3 months (around 12/13/2022).     Velta Addison, DNP, APRN, FNP-C  [facility] [place], [place]   09/12/2022, 12:43 PM

## 2022-09-21 ENCOUNTER — Ambulatory Visit: Payer: Self-pay | Admitting: *Deleted

## 2022-09-21 NOTE — Telephone Encounter (Signed)
Boil on leg, seeking med advice   Pt called reporting that she has a boil on her leg for 3 weeks, wants to go somewhere today to have it lanced and drained.     Chief Complaint: Blister, boil Symptoms: Sore on right upper thigh, "A boil" States "Goes down once I soak it but comes back up." Only one present, size of pencil eraser tip.  Frequency: 3 weeks Pertinent Negatives: Patient denies spreading Disposition: [] ED /[] Urgent Care (no appt availability in office) / [] Appointment(In office/virtual)/ []  [facility]/ [] Home Care/ [x] Refused Recommended Disposition /[] [facility]/ []  Follow-up with PCP Additional Notes:  Advised Mobile CLinic tomorrow as no availability at practice. Pt declines, states she will go to UC., pt verbalizes understanding.  Care advise provided  Reason for Disposition  Looks like a boil or deep ulcer  Answer Assessment - Initial Assessment Questions 1. APPEARANCE of SORES: "What do the sores look like?"     A boil 2. NUMBER: "How many sores are there?"     One 3. SIZE: "How big is the largest sore?"     Pencil eraser size 4. LOCATION: "Where are the sores located?"     Top of right thigh 5. ONSET: "When did the sores begin?"     3 weeks 6. TENDER: "Does it hurt when you touch it?"  (Scale 1-10; or mild, moderate, severe)      No 7. CAUSE: "What do you think is causing the sores?"     Unsure 8. OTHER SYMPTOMS: "Do you have any other symptoms?" (e.g., fever, new weakness)     No  Protocols used: Sores-A-AH

## 2022-09-21 NOTE — Telephone Encounter (Signed)
Noted  

## 2022-11-24 ENCOUNTER — Other Ambulatory Visit: Payer: Self-pay | Admitting: Internal Medicine

## 2022-11-24 DIAGNOSIS — E669 Obesity, unspecified: Secondary | ICD-10-CM

## 2022-11-24 NOTE — Telephone Encounter (Signed)
Medication Refill - Medication: phentermine 37.5 MG capsule   Has the patient contacted their pharmacy? Yes.   (Agent: If no, request that the patient contact the pharmacy for the refill. If patient does not wish to contact the pharmacy document the reason why and proceed with request.) (Agent: If yes, when and what did the pharmacy advise?)  Preferred Pharmacy (with phone number or street name):  [id number] - [place], [place] - [address] AT [facility] OF [address] Phone: [phone]  Fax: [phone]     Has the patient been seen for an appointment in the last year OR does the patient have an upcoming appointment? Yes.   Next OV is 12/15/22  Agent: Please be advised that RX refills may take up to 3 business days. We ask that you follow-up with your pharmacy.

## 2022-11-24 NOTE — Telephone Encounter (Signed)
Requested medications are due for refill today.  yes  Requested medications are on the active medications list.  yes  Last refill. 08/13/2022 #30 1 rf  Future visit scheduled.   yes  Notes to clinic.  Refill not delegated.    Requested Prescriptions  Pending Prescriptions Disp Refills   phentermine 37.5 MG capsule 30 capsule 1     Not Delegated - Neurology: Anticonvulsants - Controlled - phentermine hydrochloride Failed - 11/24/2022 10:13 AM      Failed - This refill cannot be delegated      Failed - eGFR in normal range and within 360 days    GFR, Est African American  Date Value Ref Range Status  07/09/2015 >89 >=60 mL/min Final   GFR calc Af Amer  Date Value Ref Range Status  10/06/2019 123 >59 mL/min/1.73 Final   GFR, Est Non African American  Date Value Ref Range Status  07/09/2015 >89 >=60 mL/min Final    Comment:      The estimated GFR is a calculation valid for adults (>=83 years old) that uses the CKD-EPI algorithm to adjust for age and sex. It is   not to be used for children, pregnant women, hospitalized patients,    patients on dialysis, or with rapidly changing kidney function. According to the NKDEP, eGFR >89 is normal, 60-89 shows mild impairment, 30-59 shows moderate impairment, 15-29 shows severe impairment and <15 is ESRD.      GFR calc non Af Amer  Date Value Ref Range Status  10/06/2019 107 >59 mL/min/1.73 Final   eGFR  Date Value Ref Range Status  02/05/2021 102 >59 mL/min/1.73 Final         Failed - Cr in normal range and within 360 days    Creat  Date Value Ref Range Status  07/09/2015 0.65 0.50 - 1.10 mg/dL Final   Creatinine, Ser  Date Value Ref Range Status  02/05/2021 0.73 0.57 - 1.00 mg/dL Final   Creatinine, POC  Date Value Ref Range Status  12/17/2016 200 mg/dL Final         Passed - Last BP in normal range    BP Readings from Last 1 Encounters:  09/12/22 124/88         Passed - Valid encounter within last 6 months     Recent Outpatient Visits           2 months ago Obesity (BMI 35.0-39.9 without comorbidity)   [place] [facility] Eleonore Chiquito, FNP   6 months ago Vitamin D deficiency disease   [place] [facility] [place], Marylene Land M, [place]   12 months ago Pap smear for cervical cancer screening   [facility] Marcine Matar, MD   1 year ago Obesity (BMI 35.0-39.9 without comorbidity)   [facility] Claiborne Rigg, NP   1 year ago Essential hypertension   [place] [facility] Claiborne Rigg, NP       Future Appointments             In 3 weeks Marcine Matar, MD [facility]            Passed - Weight completed in the last 3 months    Wt Readings from Last 1 Encounters:  09/12/22 214 lb (97.1 kg)

## 2022-11-25 MED ORDER — PHENTERMINE HCL 37.5 MG PO CAPS: ORAL_CAPSULE | ORAL | 1 refills | Status: DC

## 2022-12-14 ENCOUNTER — Other Ambulatory Visit: Payer: Self-pay | Admitting: Physician Assistant

## 2022-12-14 DIAGNOSIS — I10 Essential (primary) hypertension: Secondary | ICD-10-CM

## 2022-12-15 ENCOUNTER — Ambulatory Visit: Payer: Commercial Managed Care - HMO | Attending: Internal Medicine | Admitting: Internal Medicine

## 2022-12-15 ENCOUNTER — Encounter: Payer: Self-pay | Admitting: Internal Medicine

## 2022-12-15 VITALS — BP 122/86 | HR 83 | Temp 97.8°F | Ht 64.0 in | Wt 220.0 lb

## 2022-12-15 DIAGNOSIS — Z6837 Body mass index (BMI) 37.0-37.9, adult: Secondary | ICD-10-CM

## 2022-12-15 DIAGNOSIS — Z1211 Encounter for screening for malignant neoplasm of colon: Secondary | ICD-10-CM

## 2022-12-15 DIAGNOSIS — I10 Essential (primary) hypertension: Secondary | ICD-10-CM | POA: Diagnosis not present

## 2022-12-15 DIAGNOSIS — R7303 Prediabetes: Secondary | ICD-10-CM | POA: Diagnosis not present

## 2022-12-15 MED ORDER — HYDROCHLOROTHIAZIDE 12.5 MG PO TABS: ORAL_TABLET | ORAL | 1 refills | Status: DC

## 2022-12-15 MED ORDER — TOPIRAMATE 25 MG PO TABS
25.0000 mg | ORAL_TABLET | Freq: Every day | ORAL | 4 refills | Status: DC
Start: 2022-12-15 — End: 2023-08-13

## 2022-12-15 MED ORDER — PHENTERMINE HCL 37.5 MG PO CAPS: ORAL_CAPSULE | ORAL | 1 refills | Status: DC

## 2022-12-15 NOTE — Telephone Encounter (Signed)
Requested Prescriptions  Refused Prescriptions Disp Refills   hydrochlorothiazide (HYDRODIURIL) 12.5 MG tablet [Pharmacy Med Name: HYDROCHLOROTHIAZIDE 12.5MG  TABLETS] 180 tablet 1    Sig: TAKE 2 TABLETS(25 MG) BY MOUTH DAILY     Cardiovascular: Diuretics - Thiazide Failed - 12/14/2022  8:14 PM      Failed - Cr in normal range and within 180 days    Creat  Date Value Ref Range Status  07/09/2015 0.65 0.50 - 1.10 mg/dL Final   Creatinine, Ser  Date Value Ref Range Status  02/05/2021 0.73 0.57 - 1.00 mg/dL Final   Creatinine, POC  Date Value Ref Range Status  12/17/2016 200 mg/dL Final         Failed - K in normal range and within 180 days    Potassium  Date Value Ref Range Status  02/05/2021 4.0 3.5 - 5.2 mmol/L Final         Failed - Na in normal range and within 180 days    Sodium  Date Value Ref Range Status  02/05/2021 141 134 - 144 mmol/L Final         Passed - Last BP in normal range    BP Readings from Last 1 Encounters:  12/15/22 122/86         Passed - Valid encounter within last 6 months    Recent Outpatient Visits           Today Class 2 severe obesity with serious comorbidity and body mass index (BMI) of 37.0 to 37.9 in adult, unspecified obesity type [facility])   [place] [facility] Jonah Blue B, MD   3 months ago Obesity (BMI 35.0-39.9 without comorbidity)   [facility] Eleonore Chiquito, FNP   6 months ago Vitamin D deficiency disease   [place] [facility] [place], [place], [place]   1 year ago Pap smear for cervical cancer screening    [facility] Marcine Matar, MD   1 year ago Obesity (BMI 35.0-39.9 without comorbidity)   [facility] Claiborne Rigg, NP       Future Appointments             In 4 months Laural Benes, Binnie Rail, MD [facility]

## 2022-12-15 NOTE — Progress Notes (Signed)
Patient ID: Erin Glenn, female    DOB: August 23, 1970  MRN: [id number]  CC: Hypertension (HTN f/u. Med refill. Demetrius Charity loss options/No to Tdap vax.  )   Subjective: Erin Glenn is a 52 y.o. female who presents for chronic ds management Her concerns today include:  Patient with history of HTN, obesity, pre-menopausal, pre-DM, fibroids s/p ablation, vit D def.   Stress taking care of her spouse who broke RT arm in a MVA end of April.  Obesity: was out of Phentermine for 2 wks the end of May; picked up RF about 2 wks ago.  Had gotten down to 208 lbs.  When I saw her 1 yr ago she was 219 lbs.  She was walking up until her husband got in accident, she laid of exercise for several wks to take care of him.  Just restarted 2 wks ago at [employer].  Plans to go 3 days a wk for 1 hr.  She would like to get below 200 lbs.  Admits that sometimes she was not taking the Phentermine consistently. "When I take it consistently, it works."  Was on Topamax in past when she was being followed by [facility].  Found it helpful in decreasing her appetite at nights when she worked night shift.  No significant S.E.  Would like to try it again with the Phentermine.   Has a friend on Ozempic for 1 mth.  She is not wanting an injection.  Feels She can control when and if she takes Phentermine but less so with an injection.  Does not want to go the route of wgh loss surgery.  Has Weight Watchers Meals and healthy choices at home.  Pt with hx of preDM.   HTN:  taking the hydrochlorothiazide 12.5 mg one tab daily (written as 2 tabs daily) consistently. Just took the hydrochlorothiazide.   Checks BP daily at work.  Range 120-130/87-88.  Limits salt in foods Patient Active Problem List   Diagnosis Date Noted   Dysuria 03/09/2019   Obesity (BMI 35.0-39.9 without comorbidity) 11/29/2018   Essential hypertension 07/13/2015   Fibroid uterus 10/11/2013     Current Outpatient Medications on File Prior to  Visit  Medication Sig Dispense Refill   meloxicam (MOBIC) 15 MG tablet TAKE 1 TABLET(15 MG) BY MOUTH DAILY prn pain (Patient not taking: Reported on 12/15/2022) 90 tablet 0   No current facility-administered medications on file prior to visit.    Allergies  Allergen Reactions   Hydrocodone-Acetaminophen Nausea Only   Hydrocodone-Acetaminophen Nausea Only    Social History   Socioeconomic History   Marital status: Married    Spouse name: Not on file   Number of children: Not on file   Years of education: Not on file   Highest education level: Not on file  Occupational History   Not on file  Tobacco Use   Smoking status: Never   Smokeless tobacco: Never  Substance and Sexual Activity   Alcohol use: No   Drug use: No   Sexual activity: Yes    Birth control/protection: None    Comment: G0 P0  Other Topics Concern   Not on file  Social History Narrative   Not on file   Social Determinants of Health   Financial Resource Strain: Not on file  Food Insecurity: Not on file  Transportation Needs: Not on file  Physical Activity: Not on file  Stress: Not on file  Social Connections: Not on file  Intimate  Partner Violence: Not on file    Family History  Problem Relation Age of Onset   Diabetic kidney disease Mother    Hypertension Mother    Stroke Mother    Heart disease Mother    Hyperlipidemia Mother    Diabetes Mother    Alcoholism Father    Hypertension Father    Cancer - Other Father 72       throat   Diabetes Father    Breast cancer Neg Hx     Past Surgical History:  Procedure Laterality Date   BREAST BIOPSY Left 09/2019   [place] bx, ribbon, ATYPICAL LYMPHOID PROLIFERATION   BREAST BIOPSY Left 12/14/2019   [place] bx, venus marker, path pending   DILATION AND CURETTAGE OF UTERUS  2019    ROS: Review of Systems Negative except as stated above  PHYSICAL EXAM: BP 122/86 (BP Location: Left Arm, Patient Position: Sitting, Cuff Size: Normal)   Pulse 83   Temp  97.8 F (36.6 C) (Oral)   Ht 5\' 4"  (1.626 m)   Wt 220 lb (99.8 kg)   LMP 05/16/2016   SpO2 99%   BMI 37.76 kg/m   Wt Readings from Last 3 Encounters:  12/15/22 220 lb (99.8 kg)  09/12/22 214 lb (97.1 kg)  05/21/22 219 lb 9.6 oz (99.6 kg)  Repeat BP 120/90  Physical Exam  General appearance - alert, well appearing, middle-age obese African-American female and in no distress Mental status - alert, oriented to person, place, and time Neck - supple, no significant adenopathy Chest - clear to auscultation, no wheezes, rales or rhonchi, symmetric air entry Heart - normal rate, regular rhythm, normal S1, S2, no murmurs, rubs, clicks or gallops Extremities - peripheral pulses normal, no pedal edema, no clubbing or cyanosis      Latest Ref Rng & Units 02/05/2021    3:08 PM 10/06/2019   11:29 AM 11/29/2018   11:00 AM  CMP  Glucose 65 - 99 mg/dL 84  86  88   BUN 6 - 24 mg/dL 10  8  8    Creatinine 0.57 - 1.00 mg/dL 2.95  6.21  3.08   Sodium 134 - 144 mmol/L 141  140  138   Potassium 3.5 - 5.2 mmol/L 4.0  4.4  4.3   Chloride 96 - 106 mmol/L 104  102  104   CO2 20 - 29 mmol/L 21  24  23    Calcium 8.7 - 10.2 mg/dL 9.7  9.6  9.5   Total Protein 6.0 - 8.5 g/dL 7.6  7.9  7.2   Total Bilirubin 0.0 - 1.2 mg/dL 0.2  0.3  0.3   Alkaline Phos 44 - 121 IU/L 80  84  84   AST 0 - 40 IU/L 19  19  14    ALT 0 - 32 IU/L 16  13  11     Lipid Panel     Component Value Date/Time   CHOL 169 02/05/2021 1508   TRIG 118 02/05/2021 1508   HDL 60 02/05/2021 1508   CHOLHDL 2.8 02/05/2021 1508   LDLCALC 88 02/05/2021 1508    CBC    Component Value Date/Time   WBC 7.7 02/05/2021 1508   WBC 8.6 07/09/2015 2118   RBC 4.92 02/05/2021 1508   RBC 4.81 07/09/2015 2118   HGB 13.6 02/05/2021 1508   HCT 40.3 02/05/2021 1508   PLT 283 02/05/2021 1508   MCV 82 02/05/2021 1508   MCH 27.6 02/05/2021 1508   MCH  25.3 (A) 07/09/2015 2118   MCHC 33.7 02/05/2021 1508   MCHC 33.8 07/09/2015 2118   RDW 14.5  02/05/2021 1508   LYMPHSABS 1.8 09/21/2017 1045   EOSABS 0.2 09/21/2017 1045   BASOSABS 0.0 09/21/2017 1045    ASSESSMENT AND PLAN: 1. Class 2 severe obesity with serious comorbidity and body mass index (BMI) of 37.0 to 37.9 in adult, unspecified obesity type [facility]) Advised patient that if she is going to take the phentermine she should take it consistently to get maximum benefit.  We will also try her with low-dose Topamax to take at bedtime if covered by her insurance. Discussed on encourage healthy eating habits.  She declines referral to nutritionist.  She got nutritional counseling when she was in the bariatric program through [place].  Commended her for restarting her exercise program and encouraged her to be consistent. - phentermine 37.5 MG capsule; TAKE 1 CAPSULE(37.5 MG) BY MOUTH EVERY MORNING  Dispense: 30 capsule; Refill: 1 - CBC - Comprehensive metabolic panel - Lipid panel - topiramate (TOPAMAX) 25 MG tablet; Take 1 tablet (25 mg total) by mouth at bedtime.  Dispense: 30 tablet; Refill: 4  2. Prediabetes See #1 above. - Hemoglobin A1c  3. Essential hypertension Diastolic blood pressure not at goal.  Recommend increasing the hydrochlorothiazide to 25 mg daily. - hydrochlorothiazide (HYDRODIURIL) 12.5 MG tablet; TAKE 2 TABLETS(25 MG) BY MOUTH DAILY  Dispense: 180 tablet; Refill: 1  4. Screening for colon cancer Prefers cologuard instead of c-scope. - Cologuard    Patient was given the opportunity to ask questions.  Patient verbalized understanding of the plan and was able to repeat key elements of the plan.   This documentation was completed using Paediatric nurse.  Any transcriptional errors are unintentional.  Orders Placed This Encounter  Procedures   CBC   Comprehensive metabolic panel   Lipid panel   Hemoglobin A1c   Cologuard     Requested Prescriptions   Signed Prescriptions Disp Refills   hydrochlorothiazide (HYDRODIURIL) 12.5 MG  tablet 180 tablet 1    Sig: TAKE 2 TABLETS(25 MG) BY MOUTH DAILY   phentermine 37.5 MG capsule 30 capsule 1    Sig: TAKE 1 CAPSULE(37.5 MG) BY MOUTH EVERY MORNING   topiramate (TOPAMAX) 25 MG tablet 30 tablet 4    Sig: Take 1 tablet (25 mg total) by mouth at bedtime.    Return in about 4 months (around 04/16/2023).  Jonah Blue, MD, FACP

## 2022-12-16 LAB — COMPREHENSIVE METABOLIC PANEL
ALT: 17 IU/L (ref 0–32)
AST: 18 IU/L (ref 0–40)
Albumin: 4.1 g/dL (ref 3.9–4.9)
Alkaline Phosphatase: 88 IU/L (ref 44–121)
BUN/Creatinine Ratio: 13 (ref 9–23)
BUN: 8 mg/dL (ref 6–24)
Bilirubin Total: 0.4 mg/dL (ref 0.0–1.2)
CO2: 26 mmol/L (ref 20–29)
Calcium: 9.7 mg/dL (ref 8.7–10.2)
Chloride: 102 mmol/L (ref 96–106)
Creatinine, Ser: 0.64 mg/dL (ref 0.57–1.00)
Globulin, Total: 3.3 g/dL (ref 1.5–4.5)
Glucose: 80 mg/dL (ref 70–99)
Potassium: 4.3 mmol/L (ref 3.5–5.2)
Sodium: 140 mmol/L (ref 134–144)
Total Protein: 7.4 g/dL (ref 6.0–8.5)
eGFR: 108 mL/min/{1.73_m2} (ref >59)

## 2022-12-16 LAB — LIPID PANEL
Chol/HDL Ratio: 2.4 ratio (ref 0.0–4.4)
Cholesterol, Total: 162 mg/dL (ref 100–199)
HDL: 68 mg/dL (ref >39)
LDL Chol Calc (NIH): 82 mg/dL (ref 0–99)
Triglycerides: 62 mg/dL (ref 0–149)
VLDL Cholesterol Cal: 12 mg/dL (ref 5–40)

## 2022-12-16 LAB — CBC
Hematocrit: 39.9 % (ref 34.0–46.6)
Hemoglobin: 13.2 g/dL (ref 11.1–15.9)
MCH: 26.7 pg (ref 26.6–33.0)
MCHC: 33.1 g/dL (ref 31.5–35.7)
MCV: 81 fL (ref 79–97)
Platelets: 327 10*3/uL (ref 150–450)
RBC: 4.95 x10E6/uL (ref 3.77–5.28)
RDW: 14.6 % (ref 11.7–15.4)
WBC: 6.6 10*3/uL (ref 3.4–10.8)

## 2022-12-16 LAB — HEMOGLOBIN A1C
Est. average glucose Bld gHb Est-mCnc: 120 mg/dL
Hgb A1c MFr Bld: 5.8 % — ABNORMAL HIGH (ref 4.8–5.6)

## 2023-03-09 ENCOUNTER — Telehealth: Payer: Self-pay | Admitting: Internal Medicine

## 2023-03-09 NOTE — Telephone Encounter (Signed)
Pt calling to ask if Dr Laural Benes will switch her from the phentermine 37.5 MG capsule to one of the weight loss injections. Pt states the phentermine  just not working for her.  It gave her insomnia. Pt states she is not taking anymore. Pt tried to make appt, but nothing available w/in this month, and she has appt in Oct.  But pt would like to get started on the weight loss injections before then.  Then she states if she gets started now, at October appt, she can see how it is working. Pt called her insurance and they advised they could approve w/ pre authorization. Please advise.  Please call pt back to advise. [phone]

## 2023-03-11 ENCOUNTER — Ambulatory Visit: Payer: Commercial Managed Care - HMO | Admitting: Physician Assistant

## 2023-03-11 ENCOUNTER — Encounter: Payer: Self-pay | Admitting: Physician Assistant

## 2023-03-11 VITALS — BP 138/90 | HR 94 | Ht 64.0 in | Wt 237.0 lb

## 2023-03-11 DIAGNOSIS — Z6841 Body Mass Index (BMI) 40.0 and over, adult: Secondary | ICD-10-CM

## 2023-03-11 DIAGNOSIS — Z7985 Long-term (current) use of injectable non-insulin antidiabetic drugs: Secondary | ICD-10-CM

## 2023-03-11 DIAGNOSIS — I10 Essential (primary) hypertension: Secondary | ICD-10-CM | POA: Diagnosis not present

## 2023-03-11 DIAGNOSIS — R7303 Prediabetes: Secondary | ICD-10-CM

## 2023-03-11 MED ORDER — TIRZEPATIDE 2.5 MG/0.5ML ~~LOC~~ SOAJ
2.5000 mg | SUBCUTANEOUS | 0 refills | Status: DC
Start: 2023-03-11 — End: 2023-08-13

## 2023-03-11 NOTE — Progress Notes (Signed)
Established Patient Office Visit  Subjective   Patient ID: Erin Glenn, female    DOB: 23-May-1971  Age: 52 y.o. MRN: [id number]  Chief Complaint  Patient presents with   Medication Problem    Patient desires to be switched from weight loss medication due to insomnia.     States that she has been working on weight loss, states that she is unable to tolerate phentermine any longer due to adverse effect of insomnia.  States that she has a history of prediabetes, denies history of pancreatitis.  No other concerns at this time    Past Medical History:  Diagnosis Date   Fibroid uterus 2014   Unspecified vitamin D deficiency 09/26/2013   [facility]   Social History   Socioeconomic History   Marital status: Married    Spouse name: Not on file   Number of children: Not on file   Years of education: Not on file   Highest education level: Not on file  Occupational History   Not on file  Tobacco Use   Smoking status: Never   Smokeless tobacco: Never  Substance and Sexual Activity   Alcohol use: No   Drug use: No   Sexual activity: Yes    Birth control/protection: None    Comment: G0 P0  Other Topics Concern   Not on file  Social History Narrative   Not on file   Social Determinants of Health   Financial Resource Strain: Low Risk  (05/20/2021)   Received from [facility], [facility]   Overall Financial Resource Strain (CARDIA)    Difficulty of Paying Living Expenses: Not hard at all  Food Insecurity: Food Insecurity Present (05/20/2021)   Received from [facility], [facility]   Hunger Vital Sign    Worried About Running Out of Food in the Last Year: Never true    Ran Out of Food in the Last Year: Sometimes true  Transportation Needs: No Transportation Needs (05/20/2021)   Received from [facility], [facility]   PRAPARE - Transportation    Lack of Transportation (Medical): No    Lack of Transportation (Non-Medical): No   Physical Activity: Insufficiently Active (05/20/2021)   Received from [facility], [facility]   Exercise Vital Sign    Days of Exercise per Week: 2 days    Minutes of Exercise per Session: 60 min  Stress: No Stress Concern Present (05/20/2021)   Received from [facility], [facility] - Occupational Stress Questionnaire    Feeling of Stress : Not at all  Social Connections: Unknown (10/31/2021)   Received from [facility]   Social Network    Social Network: Not on file  Intimate Partner Violence: Unknown (09/25/2021)   Received from [facility], [facility]   HITS    Physically Hurt: Not on file    Insult or Talk Down To: Not on file    Threaten Physical Harm: Not on file    Scream or Curse: Not on file   Family History  Problem Relation Age of Onset   Diabetic kidney disease Mother    Hypertension Mother    Stroke Mother    Heart disease Mother    Hyperlipidemia Mother    Diabetes Mother    Alcoholism Father    Hypertension Father    Cancer - Other Father 71       throat   Diabetes Father    Breast  cancer Neg Hx    Allergies  Allergen Reactions   Hydrocodone-Acetaminophen Nausea Only   Hydrocodone-Acetaminophen Nausea Only    Review of Systems  Constitutional: Negative.   HENT: Negative.    Eyes: Negative.   Respiratory:  Negative for shortness of breath.   Cardiovascular:  Negative for chest pain.  Gastrointestinal: Negative.   Genitourinary: Negative.   Musculoskeletal: Negative.   Skin: Negative.   Neurological: Negative.   Endo/Heme/Allergies: Negative.   Psychiatric/Behavioral: Negative.        Objective:     BP (!) 138/90 (BP Location: Left Arm, Patient Position: Sitting, Cuff Size: Large)   Pulse 94   Ht 5\' 4"  (1.626 m)   Wt 237 lb (107.5 kg)   LMP 05/16/2016   SpO2 98%   BMI 40.68 kg/m  BP Readings from Last 3 Encounters:  03/11/23 (!) 138/90  12/15/22 122/86   09/12/22 124/88   Wt Readings from Last 3 Encounters:  03/11/23 237 lb (107.5 kg)  12/15/22 220 lb (99.8 kg)  09/12/22 214 lb (97.1 kg)    Physical Exam Vitals and nursing note reviewed.  Constitutional:      Appearance: Normal appearance. She is obese.  HENT:     Head: Normocephalic and atraumatic.     Right Ear: External ear normal.     Left Ear: External ear normal.     Nose: Nose normal.     Mouth/Throat:     Mouth: Mucous membranes are moist.     Pharynx: Oropharynx is clear.  Eyes:     Extraocular Movements: Extraocular movements intact.     Conjunctiva/sclera: Conjunctivae normal.     Pupils: Pupils are equal, round, and reactive to light.  Cardiovascular:     Rate and Rhythm: Normal rate and regular rhythm.     Pulses: Normal pulses.     Heart sounds: Normal heart sounds.  Pulmonary:     Effort: Pulmonary effort is normal.     Breath sounds: Normal breath sounds.  Musculoskeletal:        General: Normal range of motion.     Cervical back: Normal range of motion and neck supple.  Skin:    General: Skin is warm and dry.  Neurological:     General: No focal deficit present.     Mental Status: She is alert and oriented to person, place, and time.  Psychiatric:        Mood and Affect: Mood normal.        Behavior: Behavior normal.        Thought Content: Thought content normal.        Judgment: Judgment normal.        Assessment & Plan:   Problem List Items Addressed This Visit       Cardiovascular and Mediastinum   Essential hypertension   Other Visit Diagnoses     Prediabetes    -  Primary   Relevant Medications   tirzepatide (MOUNJARO) 2.5 MG/0.5ML Pen   Class 3 severe obesity due to excess calories with serious comorbidity and body mass index (BMI) of 40.0 to 44.9 in adult [facility])       Relevant Medications   tirzepatide (MOUNJARO) 2.5 MG/0.5ML Pen      1. Prediabetes Trial Mounjaro.  Patient education given on lifestyle modifications.   Patient to keep follow-up with primary care provider in approximately 1 month.  Patient encouraged to return the mobile unit as needed. - tirzepatide (MOUNJARO) 2.5 MG/0.5ML Pen; Inject 2.5  mg into the skin once a week.  Dispense: 2 mL; Refill: 0  2. Class 3 severe obesity due to excess calories with serious comorbidity and body mass index (BMI) of 40.0 to 44.9 in adult (HCC)  - tirzepatide (MOUNJARO) 2.5 MG/0.5ML Pen; Inject 2.5 mg into the skin once a week.  Dispense: 2 mL; Refill: 0  3. Essential hypertension Continue current regimen   I have reviewed the patient's medical history (PMH, PSH, Social History, Family History, Medications, and allergies) , and have been updated if relevant. I spent 20 minutes reviewing chart and  face to face time with patient.    Return in about 5 weeks (around 04/16/2023) for [facility].    Kasandra Knudsen Mayers, PA-C

## 2023-03-11 NOTE — Patient Instructions (Addendum)
I started the process for your medication.  Please make sure you keep your follow up with Dr. Dorethea Clan, PA-C Physician Assistant [facility] [email or web address]   Tirzepatide Injection What is this medication? TIRZEPATIDE (tir ZEP a tide) treats type 2 diabetes. It works by increasing insulin levels in your body, which decreases your blood sugar (glucose). It also reduces the amount of sugar released into your blood and slows down your digestion. Changes to diet and exercise are often combined with this medication. This medicine may be used for other purposes; ask your health care provider or pharmacist if you have questions. COMMON BRAND NAME(S): MOUNJARO What should I tell my care team before I take this medication? They need to know if you have any of these conditions: Endocrine tumors (MEN 2) or if someone in your family had these tumors Eye disease, vision problems Gallbladder disease History of pancreatitis Kidney disease Stomach or intestine problems Thyroid cancer or if someone in your family had thyroid cancer An unusual or allergic reaction to tirzepatide, other medications, foods, dyes, or preservatives Pregnant or trying to get pregnant Breast-feeding How should I use this medication? This medication is injected under the skin. You will be taught how to prepare and give it. It is given once every week (every 7 days). Keep taking it unless your health care provider tells you to stop. If you use this medication with insulin, you should inject this medication and the insulin separately. Do not mix them together. Do not give the injections right next to each other. Change (rotate) injection sites with each injection. This medication comes with INSTRUCTIONS FOR USE. Ask your pharmacist for directions on how to use this medication. Read the information carefully. Talk to your pharmacist or care team if you have  questions. It is important that you put your used needles and syringes in a special sharps container. Do not put them in a trash can. If you do not have a sharps container, call your pharmacist or care team to get one. A special MedGuide will be given to you by the pharmacist with each prescription and refill. Be sure to read this information carefully each time. Talk to your care team about the use of this medication in children. Special care may be needed. Overdosage: If you think you have taken too much of this medicine contact a poison control center or emergency room at once. NOTE: This medicine is only for you. Do not share this medicine with others. What if I miss a dose? If you miss a dose, take it as soon as you can unless it is more than 4 days (96 hours) late. If it is more than 4 days late, skip the missed dose. Take the next dose at the normal time. Do not take 2 doses within 3 days of each other. What may interact with this medication? Alcohol Antiviral medications for HIV or AIDS Aspirin and aspirin-like medications Beta blockers, such as atenolol, metoprolol, propranolol Certain medications for blood pressure, heart disease, irregular heart beat Chromium Clonidine Diuretics Estrogen or progestin hormones Fenofibrate Gemfibrozil Guanethidine Isoniazid Lanreotide Female hormones or anabolic steroids MAOIs, such as Marplan, Nardil, and Parnate Medications for weight loss Medications for allergies, asthma, cold, or cough Medications for depression, anxiety, or mental health conditions Niacin Nicotine NSAIDs, medications for pain and inflammation, such as ibuprofen or naproxen Octreotide Other medications for diabetes, such as glyburide, glipizide, or glimepiride Pasireotide Pentamidine Phenytoin Probenecid Quinolone antibiotics, such as  ciprofloxacin, levofloxacin, ofloxacin Reserpine Some herbal dietary supplements Steroid medications, such as prednisone or  cortisone Sulfamethoxazole; trimethoprim Thyroid hormones Warfarin This list may not describe all possible interactions. Give your health care provider a list of all the medicines, herbs, non-prescription drugs, or dietary supplements you use. Also tell them if you smoke, drink alcohol, or use illegal drugs. Some items may interact with your medicine. What should I watch for while using this medication? Visit your care team for regular checks on your progress. You may need blood work done while you are taking this medication. Your care team will monitor your HbA1C (A1C). This test shows what your average blood sugar (glucose) level was over the past 2 to 3 months. Know the symptoms of low blood sugar and know how to treat it. Always carry a source of quick sugar with you. Examples include hard sugar candy or glucose tablets. Make sure others know that you can choke if you eat or drink if your blood sugar is too low and you are unable to care for yourself. Get medical help at once. Tell your care team if you have high blood sugar. Your medication dose may change if your body is under stress. Some types of stress that may affect your blood sugar include fever, infection, and surgery. Check with your care team if you have severe diarrhea, nausea, and vomiting, or if you sweat a lot. The loss of too much body fluid may make it dangerous for you to take this medication. Do not share pens or cartridges with anyone, even if the needle is changed. Each pen should only be used by one person. Sharing could cause an infection. Wear a medical ID bracelet or chain. Carry a card that describes your condition. List the medications and doses you take on the card. Talk to your care team about your risk of cancer. You may be more at risk for certain types of cancer if you take this medication. Estrogen and progestin hormones may not work as well while you are taking this medication. If you take these as pills by mouth,  your care team may recommend another type of contraception for 4 weeks after you start this medication and for 4 weeks after each dose increase. Talk to your care team about contraceptive options. They can help you find the option that works for you. What side effects may I notice from receiving this medication? Side effects that you should report to your care team as soon as possible: Allergic reactions or angioedema--skin rash, itching or hives, swelling of the face, eyes, lips, tongue, arms, or legs, trouble swallowing or breathing Change in vision Dehydration--increased thirst, dry mouth, feeling faint or lightheaded, headache, dark yellow or brown urine Gallbladder problems--severe stomach pain, nausea, vomiting, fever Kidney injury--decrease in the amount of urine, swelling of the ankles, hands, or feet Pancreatitis--severe stomach pain that spreads to your back or gets worse after eating or when touched, fever, nausea, vomiting Thoughts of suicide or self-harm, worsening mood, feelings of depression Thyroid cancer--new mass or lump in the neck, pain or trouble swallowing, trouble breathing, hoarseness Side effects that usually do not require medical attention (report these to your care team if they continue or are bothersome): Diarrhea Loss of appetite Nausea Upset stomach This list may not describe all possible side effects. Call your doctor for medical advice about side effects. You may report side effects to FDA at 1-800-FDA-1088. Where should I keep my medication? Keep out of the reach of  children and pets. Refrigeration (preferred): Store in the refrigerator. Keep this medication in the original carton until you are ready to take it. Do not freeze. Protect from light. Get rid of opened vials after use, even if there is medication left. Get rid of any unopened vials or pens after the expiration date. Room Temperature: This medication may be stored at room temperature below 30 degrees C  (86 degrees F) for up to 21 days. Keep this medication in the original carton until you are ready to take it. Protect from light. Avoid exposure to extreme heat. Get rid of opened vials after use, even if there is medication left. Get rid of any unopened vials or pens after 21 days, or after they expire, whichever is first. To get rid of medications that are no longer needed or have expired: Take the medication to a medication take-back program. Check with your pharmacy or law enforcement to find a location. If you cannot return the medication, ask your pharmacist or care team how to get rid of this medication safely. NOTE: This sheet is a summary. It may not cover all possible information. If you have questions about this medicine, talk to your doctor, pharmacist, or health care provider.  2024 Elsevier/Gold Standard (2022-10-01 00:00:00)

## 2023-03-11 NOTE — Telephone Encounter (Signed)
Pt called back, highly upset that she was not made aware of this information. Says that she cannot wait another month to be seen for this matter, is currently scheduled 04/16/2023 but says she needs to be seen much sooner.

## 2023-03-11 NOTE — Telephone Encounter (Signed)
Pt called [facility] to check on [name]'s availability.

## 2023-03-11 NOTE — Telephone Encounter (Signed)
Spoke with patient . Patient is going to our [facility] today.

## 2023-03-15 ENCOUNTER — Other Ambulatory Visit: Payer: Self-pay

## 2023-03-15 ENCOUNTER — Telehealth: Payer: Self-pay | Admitting: Internal Medicine

## 2023-03-15 NOTE — Telephone Encounter (Signed)
Patient called in stated he insurance is still waiting on the prior auth for tirzepatide [facility]) 2.5 MG/0.5ML Pen; Inject 2.5 mg. Please f/u with patient

## 2023-03-16 ENCOUNTER — Telehealth: Payer: Self-pay | Admitting: Internal Medicine

## 2023-03-16 NOTE — Telephone Encounter (Signed)
Pt is calling in because she spoke with her insurance and they said they haven't received a Prior Auth for the medication. Pt says the insurance company said they would cover the generic for preventative use. Please follow up with pt.

## 2023-03-17 ENCOUNTER — Other Ambulatory Visit: Payer: Self-pay

## 2023-03-17 NOTE — Telephone Encounter (Signed)
Pt saw Cari on mobile unit and was prescribed Mounjaro, insurance does not cover, kelly states medication needs to be changed to wegovy or zepbound.

## 2023-03-17 NOTE — Telephone Encounter (Signed)
Pt called back regarding her Prior Auth needed for tirzepatide [facility]) 2.5 MG/0.5ML Pen pt is upset that this has not yet been resolved she says

## 2023-03-17 NOTE — Telephone Encounter (Signed)
Call placed to patient and she was informed that message has been sent to PCP for medication change and she will be called once change is made.

## 2023-03-18 ENCOUNTER — Ambulatory Visit: Payer: Commercial Managed Care - HMO | Admitting: Family Medicine

## 2023-03-18 MED ORDER — SEMAGLUTIDE-WEIGHT MANAGEMENT 0.25 MG/0.5ML ~~LOC~~ SOAJ: 0.2500 mg | SUBCUTANEOUS | 1 refills | Status: DC

## 2023-03-18 NOTE — Telephone Encounter (Signed)
I have sent a Prescription for [facility] to the Pharmacy.

## 2023-03-19 ENCOUNTER — Other Ambulatory Visit: Payer: Self-pay

## 2023-03-19 NOTE — Telephone Encounter (Signed)
Hey Kel, the medication has been changed does patient need a PA.

## 2023-03-19 NOTE — Telephone Encounter (Signed)
Noted  

## 2023-03-24 ENCOUNTER — Telehealth: Payer: Self-pay

## 2023-03-24 NOTE — Telephone Encounter (Signed)
Patient [id number] Patient DOB-16-Jul-1970 Request- [id number] Medication Name- Greggory Keen 2.5mg /0.5 Pen injector  Claim reviewed by: [facility]  Based on the provided information, Rosann Auerbach has denied to cover [facility] 2.5mg /0.5 pen injector due to not meeting the criteria necessary to approve medication.  There is no indication patient has type 2 diabetes mellitus. Rosann Auerbach does not cover Mounjaro for any other indication because it is only approved for type 2 diabetes mellitus.

## 2023-03-29 ENCOUNTER — Other Ambulatory Visit: Payer: Self-pay

## 2023-04-15 ENCOUNTER — Other Ambulatory Visit: Payer: Self-pay | Admitting: Internal Medicine

## 2023-04-16 ENCOUNTER — Ambulatory Visit: Payer: Managed Care, Other (non HMO) | Admitting: Internal Medicine

## 2023-04-16 ENCOUNTER — Telehealth: Payer: Self-pay | Admitting: Internal Medicine

## 2023-04-16 ENCOUNTER — Encounter: Payer: Self-pay | Admitting: Internal Medicine

## 2023-04-16 NOTE — Telephone Encounter (Signed)
Patient was on my schedule today but it came late.  She decided not to wait to see if there would be a no-show.  Please call and reschedule.

## 2023-04-16 NOTE — Telephone Encounter (Signed)
Requested medication (s) are due for refill today- unsure  Requested medication (s) are on the active medication list -yes  Future visit scheduled -no  Last refill: 12/15/22 #30 1Rf  Notes to clinic: non delegated Rx  Requested Prescriptions  Pending Prescriptions Disp Refills   phentermine 37.5 MG capsule [Pharmacy Med Name: PHENTERMINE 37.5MG  CAPSULES] 30 capsule     Sig: TAKE 1 CAPSULE(37.5 MG) BY MOUTH EVERY MORNING     Not Delegated - Neurology: Anticonvulsants - Controlled - phentermine hydrochloride Failed - 04/15/2023  9:12 AM      Failed - This refill cannot be delegated      Failed - Last BP in normal range    BP Readings from Last 1 Encounters:  03/11/23 (!) 138/90         Passed - eGFR in normal range and within 360 days    GFR, Est African American  Date Value Ref Range Status  07/09/2015 >89 >=60 mL/min Final   GFR calc Af Amer  Date Value Ref Range Status  10/06/2019 123 >59 mL/min/1.73 Final   GFR, Est Non African American  Date Value Ref Range Status  07/09/2015 >89 >=60 mL/min Final    Comment:      The estimated GFR is a calculation valid for adults (>=46 years old) that uses the CKD-EPI algorithm to adjust for age and sex. It is   not to be used for children, pregnant women, hospitalized patients,    patients on dialysis, or with rapidly changing kidney function. According to the NKDEP, eGFR >89 is normal, 60-89 shows mild impairment, 30-59 shows moderate impairment, 15-29 shows severe impairment and <15 is ESRD.      GFR calc non Af Amer  Date Value Ref Range Status  10/06/2019 107 >59 mL/min/1.73 Final   eGFR  Date Value Ref Range Status  12/15/2022 108 >59 mL/min/1.73 Final         Passed - Cr in normal range and within 360 days    Creat  Date Value Ref Range Status  07/09/2015 0.65 0.50 - 1.10 mg/dL Final   Creatinine, Ser  Date Value Ref Range Status  12/15/2022 0.64 0.57 - 1.00 mg/dL Final   Creatinine, POC  Date Value Ref  Range Status  12/17/2016 200 mg/dL Final         Passed - Valid encounter within last 6 months    Recent Outpatient Visits           4 months ago Class 2 severe obesity with serious comorbidity and body mass index (BMI) of 37.0 to 37.9 in adult, unspecified obesity type [facility])   [place] [facility] Jonah Blue B, MD   7 months ago Obesity (BMI 35.0-39.9 without comorbidity)   [place] [facility] Eleonore Chiquito, [place]   11 months ago Vitamin D deficiency disease   [place] [facility] [place], [place], [place]   1 year ago Pap smear for cervical cancer screening    [facility] Marcine Matar, MD   1 year ago Obesity (BMI 35.0-39.9 without comorbidity)   [facility] [place], [place] W, NP              Passed - Weight completed in the last 3 months    Wt Readings from Last 1 Encounters:  03/11/23 237 lb (107.5 kg)  Requested Prescriptions  Pending Prescriptions Disp Refills   phentermine 37.5 MG capsule [Pharmacy Med Name: PHENTERMINE 37.5MG  CAPSULES] 30 capsule     Sig: TAKE 1 CAPSULE(37.5 MG) BY MOUTH EVERY MORNING     Not Delegated - Neurology: Anticonvulsants - Controlled - phentermine hydrochloride Failed - 04/15/2023  9:12 AM      Failed - This refill cannot be delegated      Failed - Last BP in normal range    BP Readings from Last 1 Encounters:  03/11/23 (!) 138/90         Passed - eGFR in normal range and within 360 days    GFR, Est African American  Date Value Ref Range Status  07/09/2015 >89 >=60 mL/min Final   GFR calc Af Amer  Date Value Ref Range Status  10/06/2019 123 >59 mL/min/1.73 Final   GFR, Est Non African American  Date Value Ref Range Status  07/09/2015 >89 >=60 mL/min Final    Comment:      The estimated GFR is a calculation valid for adults (>=49 years old) that uses the  CKD-EPI algorithm to adjust for age and sex. It is   not to be used for children, pregnant women, hospitalized patients,    patients on dialysis, or with rapidly changing kidney function. According to the NKDEP, eGFR >89 is normal, 60-89 shows mild impairment, 30-59 shows moderate impairment, 15-29 shows severe impairment and <15 is ESRD.      GFR calc non Af Amer  Date Value Ref Range Status  10/06/2019 107 >59 mL/min/1.73 Final   eGFR  Date Value Ref Range Status  12/15/2022 108 >59 mL/min/1.73 Final         Passed - Cr in normal range and within 360 days    Creat  Date Value Ref Range Status  07/09/2015 0.65 0.50 - 1.10 mg/dL Final   Creatinine, Ser  Date Value Ref Range Status  12/15/2022 0.64 0.57 - 1.00 mg/dL Final   Creatinine, POC  Date Value Ref Range Status  12/17/2016 200 mg/dL Final         Passed - Valid encounter within last 6 months    Recent Outpatient Visits           4 months ago Class 2 severe obesity with serious comorbidity and body mass index (BMI) of 37.0 to 37.9 in adult, unspecified obesity type [facility])   [place] [facility] Jonah Blue B, MD   7 months ago Obesity (BMI 35.0-39.9 without comorbidity)   [facility] Eleonore Chiquito, [place]   11 months ago Vitamin D deficiency disease   [place] [facility] [place], [place], [place]   1 year ago Pap smear for cervical cancer screening   [place] [facility] Marcine Matar, MD   1 year ago Obesity (BMI 35.0-39.9 without comorbidity)   [facility] [place], [place] W, NP              Passed - Weight completed in the last 3 months    Wt Readings from Last 1 Encounters:  03/11/23 237 lb (107.5 kg)

## 2023-04-16 NOTE — Telephone Encounter (Signed)
Copied from CRM [id number]. Topic: General - Other >> Apr 16, 2023 10:53 AM Macon Large wrote: Reason for CRM: Pt requests that the office manager return her call at [phone]         (Per rashiema note) Pt came in this morning at 10:24am  verified name and DOB... I told pt she was late she said well I called and someone told me to still come in I knew the 10 mins policy I said ma'am once you call it  goes to call center so any info you gave them I don't see it in our message so I will message the cma and let her know that you are here give me 2 mins  so she sat down already fussing stating that Dr Laural Benes always late anyway so I jut closed my window and waited for a responded from cma.. so once I received the message from cma that she had to wait for no show she will see you ( you will be seen ) but once she has a no show we will check you in she started saying no  no no  I was on on time I said ma'am you were 14 mins late she said I was at the door at 10:24 I said yes ma'am you still considered late  ( so I gave her example I said 10:10 you on time 10:11 you one min late so 10:24am 14 mins late. bc you wasn't check in at 10:24am then she said ok ok never mind got up and left. I had carly up here as well when this situation happened.

## 2023-04-18 ENCOUNTER — Other Ambulatory Visit: Payer: Self-pay | Admitting: Internal Medicine

## 2023-04-18 DIAGNOSIS — E66812 Obesity, class 2: Secondary | ICD-10-CM

## 2023-04-18 MED ORDER — PHENTERMINE HCL 37.5 MG PO CAPS
ORAL_CAPSULE | ORAL | 1 refills | Status: DC
Start: 2023-04-18 — End: 2023-07-04

## 2023-04-20 ENCOUNTER — Ambulatory Visit: Payer: Managed Care, Other (non HMO) | Admitting: Internal Medicine

## 2023-04-23 NOTE — Telephone Encounter (Signed)
Noted  

## 2023-05-17 ENCOUNTER — Ambulatory Visit: Payer: Managed Care, Other (non HMO) | Admitting: Internal Medicine

## 2023-07-01 ENCOUNTER — Other Ambulatory Visit: Payer: Self-pay | Admitting: Internal Medicine

## 2023-07-01 DIAGNOSIS — E66812 Obesity, class 2: Secondary | ICD-10-CM

## 2023-07-07 ENCOUNTER — Telehealth: Payer: Self-pay | Admitting: *Deleted

## 2023-07-07 NOTE — Telephone Encounter (Signed)
 Called patient to inform Rx is at the pharmacy.  Left message on voicemail to return call.

## 2023-07-07 NOTE — Telephone Encounter (Signed)
-----   Message from Concetta Dee sent at 07/04/2023  6:49 PM EST ----- Regarding: Needs f/u appt Let pt know 1 RF given on Phentermine .  Needs to be seen prior to further refill request.

## 2023-07-12 ENCOUNTER — Other Ambulatory Visit: Payer: Self-pay | Admitting: Internal Medicine

## 2023-07-12 DIAGNOSIS — I10 Essential (primary) hypertension: Secondary | ICD-10-CM

## 2023-07-13 ENCOUNTER — Telehealth: Payer: Self-pay

## 2023-07-13 NOTE — Telephone Encounter (Signed)
Courtesy refill. Requested Prescriptions  Pending Prescriptions Disp Refills   hydrochlorothiazide (HYDRODIURIL) 12.5 MG tablet [Pharmacy Med Name: HYDROCHLOROTHIAZIDE 12.5MG  TABLETS] 60 tablet 0    Sig: TAKE 2 TABLETS(25 MG) BY MOUTH DAILY     Cardiovascular: Diuretics - Thiazide Failed - 07/13/2023 10:23 AM      Failed - Cr in normal range and within 180 days    Creat  Date Value Ref Range Status  07/09/2015 0.65 0.50 - 1.10 mg/dL Final   Creatinine, Ser  Date Value Ref Range Status  12/15/2022 0.64 0.57 - 1.00 mg/dL Final   Creatinine, POC  Date Value Ref Range Status  12/17/2016 200 mg/dL Final         Failed - K in normal range and within 180 days    Potassium  Date Value Ref Range Status  12/15/2022 4.3 3.5 - 5.2 mmol/L Final         Failed - Na in normal range and within 180 days    Sodium  Date Value Ref Range Status  12/15/2022 140 134 - 144 mmol/L Final         Failed - Last BP in normal range    BP Readings from Last 1 Encounters:  03/11/23 (!) 138/90         Failed - Valid encounter within last 6 months    Recent Outpatient Visits           7 months ago Class 2 severe obesity with serious comorbidity and body mass index (BMI) of 37.0 to 37.9 in adult, unspecified obesity type [facility])   [facility] Of [place]. [facility] Jonah Blue B, MD   10 months ago Obesity (BMI 35.0-39.9 without comorbidity)   [facility] - [facility] Dept Of [place]. [facility] Eleonore Chiquito, FNP   1 year ago Vitamin D deficiency disease   [facility] - [facility] Dept Of [place]. [facility] [place], [place], [place]   1 year ago Pap smear for cervical cancer screening   [facility] - [facility] Dept Of [place]. [facility] Marcine Matar, MD   1 year ago Obesity (BMI 35.0-39.9 without comorbidity)   [facility] - A Dept Of [place]. [facility] Claiborne Rigg, [place]

## 2023-07-13 NOTE — Telephone Encounter (Signed)
Called pt to schedule follow up ov.  Pt states that she was seen at [facility] on 03/10/2022, and that is within 6 month window. Per encounter at [facility] pt was to be seen in clinic in 5 weeks. Pt scheduled follow up apt, but did not show on 10/25. Pt declined scheduling follow up appt. Gave pt courtesy refill today 30 supply.

## 2023-07-13 NOTE — Telephone Encounter (Signed)
Called pt to schedule ov. Pt declined.

## 2023-08-06 ENCOUNTER — Other Ambulatory Visit: Payer: Self-pay | Admitting: Internal Medicine

## 2023-08-06 DIAGNOSIS — E66812 Obesity, class 2: Secondary | ICD-10-CM

## 2023-08-09 ENCOUNTER — Other Ambulatory Visit: Payer: Self-pay | Admitting: Internal Medicine

## 2023-08-09 NOTE — Telephone Encounter (Signed)
Copied from CRM [id number]. Topic: Clinical - Medication Refill >> Aug 09, 2023  2:36 PM Eunice Blase wrote: Most Recent Primary Care Visit:  Provider: Jonah Blue B  Department: [facility]  Visit Type: OFFICE VISIT  Date: 12/15/2022  Medication: phentermine 37.5 MG capsule  Has the patient contacted their pharmacy? Yes (Agent: If no, request that the patient contact the pharmacy for the refill. If patient does not wish to contact the pharmacy document the reason why and proceed with request.) (Agent: If yes, when and what did the pharmacy advise?)  Is this the correct pharmacy for this prescription? Yes If no, delete pharmacy and type the correct one.  This is the patient's preferred pharmacy:  [id number] - [place], [place] - [address] AT [facility] OF [address] [place] [address] Phone: [phone] Fax: [phone]     Has the prescription been filled recently? Yes  Is the patient out of the medication? Yes  Has the patient been seen for an appointment in the last year OR does the patient have an upcoming appointment? Yes  Can we respond through MyChart? Yes  Agent: Please be advised that Rx refills may take up to 3 business days. We ask that you follow-up with your pharmacy.

## 2023-08-09 NOTE — Telephone Encounter (Signed)
Copied from CRM [id number]. Topic: Clinical - Medication Refill >> Aug 09, 2023  9:00 AM Payton Doughty wrote: Most Recent Primary Care Visit:  Provider: Jonah Blue B  Department: [facility]  Visit Type: OFFICE VISIT  Date: 12/15/2022  Medication:  phentermine 37.5 MG capsule  Has the patient contacted their pharmacy? Yes Pt very upset this Rx not filled.  Advised pt notes mentioned she needed appt.  Pt states she is going to  report this dr/office for not filling her Rx.    Is this the correct pharmacy for this prescription? Yes If no, delete pharmacy and type the correct one.  This is the patient's preferred pharmacy:  [id number] - [place], [place] - [address] AT [facility] OF [address] [place]  [address] Phone: [phone] Fax: [phone]   Has the prescription been filled recently? yes  Is the patient out of the medication? Yes  Has the patient been seen for an appointment in the last year OR does the patient have an upcoming appointment? Yes  Can we respond through MyChart? No  Agent: Please be advised that Rx refills may take up to 3 business days. We ask that you follow-up with your pharmacy.

## 2023-08-09 NOTE — Telephone Encounter (Signed)
Requested medication (s) are due for refill today: yes   Requested medication (s) are on the active medication list: yes   Last refill:  07/04/23 #30 0 refills   Future visit scheduled: no   Notes to clinic:  not delegated per protocol. Called patient to schedule appt for med refills. No answer, LVMTCB. Do you want to refill rx?     Requested Prescriptions  Pending Prescriptions Disp Refills   phentermine 37.5 MG capsule [Pharmacy Med Name: PHENTERMINE 37.5MG  CAPSULES] 30 capsule     Sig: TAKE 1 CAPSULE(37.5 MG) BY MOUTH EVERY DAY IN THE MORNING     Not Delegated - Neurology: Anticonvulsants - Controlled - phentermine hydrochloride Failed - 08/09/2023  9:36 AM      Failed - This refill cannot be delegated      Failed - Last BP in normal range    BP Readings from Last 1 Encounters:  03/11/23 (!) 138/90         Failed - Valid encounter within last 6 months    Recent Outpatient Visits           7 months ago Class 2 severe obesity with serious comorbidity and body mass index (BMI) of 37.0 to 37.9 in adult, unspecified obesity type [facility])   [facility] Dept Of [place]. [facility] Jonah Blue B, MD   11 months ago Obesity (BMI 35.0-39.9 without comorbidity)   [facility] - [facility] Dept Of [place]. [facility] Eleonore Chiquito, FNP   1 year ago Vitamin D deficiency disease   [facility] - A Dept Of [place]. [facility] [place], [place], [place]   1 year ago Pap smear for cervical cancer screening   [facility] [place] - A Dept Of [place]. [facility] Marcine Matar, MD   1 year ago Obesity (BMI 35.0-39.9 without comorbidity)   [facility] [facility] - A Dept Of [place]. [facility] [place], [place] W, NP              Failed - Weight completed in the last 3 months    Wt Readings from Last 1 Encounters:  03/11/23 237 lb (107.5 kg)          Passed - eGFR in normal range and within 360 days    GFR, Est African American  Date Value Ref Range Status  07/09/2015 >89 >=60 mL/min Final   GFR calc Af Amer  Date Value Ref Range Status  10/06/2019 123 >59 mL/min/1.73 Final   GFR, Est Non African American  Date Value Ref Range Status  07/09/2015 >89 >=60 mL/min Final    Comment:      The estimated GFR is a calculation valid for adults (>=38 years old) that uses the CKD-EPI algorithm to adjust for age and sex. It is   not to be used for children, pregnant women, hospitalized patients,    patients on dialysis, or with rapidly changing kidney function. According to the NKDEP, eGFR >89 is normal, 60-89 shows mild impairment, 30-59 shows moderate impairment, 15-29 shows severe impairment and <15 is ESRD.      GFR calc non Af Amer  Date Value Ref Range Status  10/06/2019 107 >59 mL/min/1.73 Final   eGFR  Date Value Ref Range Status  12/15/2022 108 >59 mL/min/1.73 Final         Passed - Cr in  normal range and within 360 days    Creat  Date Value Ref Range Status  07/09/2015 0.65 0.50 - 1.10 mg/dL Final   Creatinine, Ser  Date Value Ref Range Status  12/15/2022 0.64 0.57 - 1.00 mg/dL Final   Creatinine, POC  Date Value Ref Range Status  12/17/2016 200 mg/dL Final

## 2023-08-09 NOTE — Telephone Encounter (Signed)
Called patient to schedule appt for medication refills. Last OV 12/15/22. No answer, LVMTCB [phone].

## 2023-08-10 ENCOUNTER — Telehealth: Payer: Self-pay

## 2023-08-10 NOTE — Telephone Encounter (Signed)
Copied from CRM [id number]. Topic: Clinical - Medication Question >> Aug 10, 2023 12:43 PM Erin Glenn wrote: Reason for CRM: patient called about her medication, upset that she was not told about the 72hrs turn around time frame for the  phentermine 37.5 MG capsule. Please f/u with patient

## 2023-08-10 NOTE — Telephone Encounter (Signed)
Patient advised that per note from 07/04/2023 she will need an appointment for further refills. Patient was upset asking why she has to be seen for refills she always gets 90 day refills on all her medications and this time was only given 30 days. Advised that certain medications require closer follow-up due to possible side -effects. Patient was agreeable to attend appointment.

## 2023-08-13 ENCOUNTER — Ambulatory Visit: Payer: Commercial Managed Care - HMO | Attending: Internal Medicine | Admitting: Internal Medicine

## 2023-08-13 ENCOUNTER — Other Ambulatory Visit: Payer: Self-pay

## 2023-08-13 VITALS — BP 136/96 | HR 68 | Temp 97.4°F | Ht 64.0 in | Wt 226.0 lb

## 2023-08-13 DIAGNOSIS — I10 Essential (primary) hypertension: Secondary | ICD-10-CM

## 2023-08-13 DIAGNOSIS — Z6838 Body mass index (BMI) 38.0-38.9, adult: Secondary | ICD-10-CM

## 2023-08-13 DIAGNOSIS — E66812 Obesity, class 2: Secondary | ICD-10-CM | POA: Diagnosis not present

## 2023-08-13 DIAGNOSIS — Z2821 Immunization not carried out because of patient refusal: Secondary | ICD-10-CM

## 2023-08-13 MED ORDER — PHENTERMINE HCL 37.5 MG PO CAPS
37.5000 mg | ORAL_CAPSULE | Freq: Every morning | ORAL | 4 refills | Status: DC
Start: 2023-08-13 — End: 2023-08-16
  Filled 2023-08-13: qty 30, 30d supply, fill #0

## 2023-08-13 MED ORDER — TOPIRAMATE 25 MG PO TABS
25.0000 mg | ORAL_TABLET | Freq: Every day | ORAL | 4 refills | Status: DC
  Filled 2023-08-13: qty 30, 30d supply, fill #0

## 2023-08-13 NOTE — Patient Instructions (Addendum)
Please locate your Cologuard test, use it and send it in so that you can be screened for colon cancer.

## 2023-08-13 NOTE — Progress Notes (Signed)
 Patient ID: Erin Glenn, female    DOB: 1970-08-26  MRN: [id number]  CC: Hypertension (HTN f/u. Med refills/Unable to get semaglutide due to PA needed - requesting for it to be sent downstairs/No to all vax)   Subjective: Erin Glenn is a 53 y.o. female who presents for chronic ds management. Pt's appt was 8:30 a.m; she present 8:50 a.m Her concerns today include:  Patient with history of HTN, obesity, pre-menopausal, pre-DM, fibroids s/p ablation, vit D def.   Obesity: on last visit with me 11/2022, we had left her on phentermine 37.5 mg and had added Topamax at bedtime.  Since then she had seen the physician assistant in our mobile unit and was prescribed Mounjaro.  Her insurance declined coverage.  She was then prescribed semaglutide but never got that.  I think it was probably declined by her insurance as well. She tells me she is currently not taking anything.  However prescription was sent last month for refill on phentermine.  She is currently out of it and requesting refill.  She would also like a refill on Topamax.  States that she feels better taking the phentermine and Topamax rather than doing an injectable because she can easily stop the medicines if she has any side effects.   -Does not take the phentermine every day.  Sometimes goes a week without taking it.  States she takes it when she feels her appetite is up or cannot control the cravings.  She has figured out that she wants to eat more when she is stressed.  Currently things with work is stressful as they are short staffed and sometimes she is working up until 10 PM at night. -She was going to the gym 3 to 4 days a week but over the past few weeks she has only been able to make it once a week due to work schedule.  She states that things will change, March 1. -Weight is down 11 pounds since September 2024.  HTN: Should be taking hydrochlorothiazide 25 mg daily.  However she has continued to take as needed.  States that  her blood pressure normally runs in upper 100teens/70-84 but will be higher when stressed. Weight is down 11 pounds since 02/2023.   Wegovy/Semeglitude - never got it  HM: Cologuard ordered before.  Reports she moved 02/2023 so it may have been sent back. Order was place 11/2022. She will look to see if it is in one of her boxes.  Decline flu and shingles vaccine Patient Active Problem List   Diagnosis Date Noted   Dysuria 03/09/2019   Obesity (BMI 35.0-39.9 without comorbidity) 11/29/2018   Essential hypertension 07/13/2015   Fibroid uterus 10/11/2013     Current Outpatient Medications on File Prior to Visit  Medication Sig Dispense Refill   hydrochlorothiazide (HYDRODIURIL) 12.5 MG tablet TAKE 2 TABLETS(25 MG) BY MOUTH DAILY 60 tablet 0   meloxicam (MOBIC) 15 MG tablet TAKE 1 TABLET(15 MG) BY MOUTH DAILY prn pain (Patient not taking: Reported on 08/13/2023) 90 tablet 0   No current facility-administered medications on file prior to visit.    Allergies  Allergen Reactions   Hydrocodone-Acetaminophen Nausea Only   Hydrocodone-Acetaminophen Nausea Only    Social History   Socioeconomic History   Marital status: Married    Spouse name: Not on file   Number of children: Not on file   Years of education: Not on file   Highest education level: Not on file  Occupational History  Not on file  Tobacco Use   Smoking status: Never   Smokeless tobacco: Never  Substance and Sexual Activity   Alcohol use: No   Drug use: No   Sexual activity: Yes    Birth control/protection: None    Comment: G0 P0  Other Topics Concern   Not on file  Social History Narrative   Not on file   Social Drivers of Health   Financial Resource Strain: Low Risk  (05/20/2021)   Received from [facility], [facility]   Overall Financial Resource Strain (CARDIA)    Difficulty of Paying Living Expenses: Not hard at all  Food Insecurity: Food Insecurity Present (05/20/2021)   Received from [facility], [facility]   Hunger Vital Sign    Worried About Running Out of Food in the Last Year: Never true    Ran Out of Food in the Last Year: Sometimes true  Transportation Needs: No Transportation Needs (05/20/2021)   [facility], [facility]   PRAPARE - Transportation    Lack of Transportation (Medical): No    Lack of Transportation (Non-Medical): No  Physical Activity: Insufficiently Active (05/20/2021)   [facility]   Exercise Vital Sign    Days of Exercise per Week: 2 days    Minutes of Exercise per Session: 60 min  Stress: No Stress Concern Present (05/20/2021)   Received from [facility] - Occupational Stress Questionnaire    Feeling of Stress : Not at all  Social Connections: Unknown (10/31/2021)   [facility] from [facility], [facility]   Social Network    Social Network: Not on file  Intimate Partner Violence: Unknown (09/25/2021)   [facility] from [facility]   HITS    Physically Hurt: Not on file    Insult or Talk Down To: Not on file    Threaten Physical Harm: Not on file    Scream or Curse: Not on file    Family History  Problem Relation Age of Onset   Diabetic kidney disease Mother    Hypertension Mother    Stroke Mother    Heart disease Mother    Hyperlipidemia Mother    Diabetes Mother    Alcoholism Father    Hypertension Father    Cancer - Other Father 38       throat   Diabetes Father    Breast cancer Neg Hx     Past Surgical History:  Procedure Laterality Date   BREAST BIOPSY Left 09/2019   [place] bx, ribbon, ATYPICAL LYMPHOID PROLIFERATION   BREAST BIOPSY Left 12/14/2019   [place] bx, venus marker, path pending   DILATION AND CURETTAGE OF UTERUS  2019    ROS: Review of Systems Negative except as stated above  PHYSICAL EXAM: BP (!) 136/96   Pulse 68   Temp (!) 97.4 F (36.3 C) (Oral)   Ht 5\' 4"  (1.626 m)   Wt 226 lb  (102.5 kg)   LMP 05/16/2016   SpO2 98%   BMI 38.79 kg/m   Wt Readings from Last 3 Encounters:  08/13/23 226 lb (102.5 kg)  03/11/23 237 lb (107.5 kg)  12/15/22 220 lb (99.8 kg)    Physical Exam  General appearance - alert, well appearing, middle-age African-American female and in no distress Mental status - normal mood, behavior, speech, dress, motor activity, and thought processes Chest - clear to auscultation, no wheezes, rales or  rhonchi, symmetric air entry Heart - normal rate, regular rhythm, normal S1, S2, no murmurs, rubs, clicks or gallops Extremities - peripheral pulses normal, no pedal edema, no clubbing or cyanosis   The 10-year ASCVD risk score (Arnett DK, et al., 2019) is: 2.7%   Values used to calculate the score:     Age: 53 years     Sex: Female     Is Non-Hispanic African American: Yes     Diabetic: No     Tobacco smoker: No     Systolic Blood Pressure: 136 mmHg     Is BP treated: Yes     HDL Cholesterol: 68 mg/dL     Total Cholesterol: 162 mg/dL     Latest Ref Rng & Units 12/15/2022   11:29 AM 02/05/2021    3:08 PM 10/06/2019   11:29 AM  CMP  Glucose 70 - 99 mg/dL 80  84  86   BUN 6 - 24 mg/dL 8  10  8    Creatinine 0.57 - 1.00 mg/dL 1.61  0.96  0.45   Sodium 134 - 144 mmol/L 140  141  140   Potassium 3.5 - 5.2 mmol/L 4.3  4.0  4.4   Chloride 96 - 106 mmol/L 102  104  102   CO2 20 - 29 mmol/L 26  21  24    Calcium 8.7 - 10.2 mg/dL 9.7  9.7  9.6   Total Protein 6.0 - 8.5 g/dL 7.4  7.6  7.9   Total Bilirubin 0.0 - 1.2 mg/dL 0.4  0.2  0.3   Alkaline Phos 44 - 121 IU/L 88  80  84   AST 0 - 40 IU/L 18  19  19    ALT 0 - 32 IU/L 17  16  13     Lipid Panel     Component Value Date/Time   CHOL 162 12/15/2022 1129   TRIG 62 12/15/2022 1129   HDL 68 12/15/2022 1129   CHOLHDL 2.4 12/15/2022 1129   LDLCALC 82 12/15/2022 1129    CBC    Component Value Date/Time   WBC 6.6 12/15/2022 1129   WBC 8.6 07/09/2015 2118   RBC 4.95 12/15/2022 1129   RBC 4.81  07/09/2015 2118   HGB 13.2 12/15/2022 1129   HCT 39.9 12/15/2022 1129   PLT 327 12/15/2022 1129   MCV 81 12/15/2022 1129   MCH 26.7 12/15/2022 1129   MCH 25.3 (A) 07/09/2015 2118   MCHC 33.1 12/15/2022 1129   MCHC 33.8 07/09/2015 2118   RDW 14.6 12/15/2022 1129   LYMPHSABS 1.8 09/21/2017 1045   EOSABS 0.2 09/21/2017 1045   BASOSABS 0.0 09/21/2017 1045    ASSESSMENT AND PLAN: 1. Class 2 severe obesity due to excess calories with serious comorbidity and body mass index (BMI) of 38.0 to 38.9 in adult [facility]) (Primary) Commended her on weight loss so far. Try to encourage healthy eating habits.  She tries to getting her exercises much as she can given her work schedule.  Refill sent on phentermine and Topamax. - phentermine 37.5 MG capsule; Take 1 capsule (37.5 mg total) by mouth in the morning. Needs to be seen prior to next refill.  Dispense: 30 capsule; Refill: 4 - topiramate (TOPAMAX) 25 MG tablet; Take 1 tablet (25 mg total) by mouth at bedtime.  Dispense: 30 tablet; Refill: 4  2. Essential hypertension Not at goal.  Strongly encouraged her to take the hydrochlorothiazide every day rather than as needed.  3. Influenza vaccination declined  Recommended.  Patient declined.  4. Herpes zoster vaccination declined   Patient was given the opportunity to ask questions.  Patient verbalized understanding of the plan and was able to repeat key elements of the plan.   This documentation was completed using Paediatric nurse.  Any transcriptional errors are unintentional.  No orders of the defined types were placed in this encounter.    Requested Prescriptions   Signed Prescriptions Disp Refills   phentermine 37.5 MG capsule 30 capsule 4    Sig: Take 1 capsule (37.5 mg total) by mouth in the morning. Needs to be seen prior to next refill.   topiramate (TOPAMAX) 25 MG tablet 30 tablet 4    Sig: Take 1 tablet (25 mg total) by mouth at bedtime.    Return in about 4  months (around 12/11/2023).  Jonah Blue, MD, FACP

## 2023-08-14 ENCOUNTER — Encounter: Payer: Self-pay | Admitting: Internal Medicine

## 2023-08-16 ENCOUNTER — Other Ambulatory Visit: Payer: Self-pay

## 2023-08-16 ENCOUNTER — Other Ambulatory Visit: Payer: Self-pay | Admitting: Internal Medicine

## 2023-08-16 DIAGNOSIS — I10 Essential (primary) hypertension: Secondary | ICD-10-CM

## 2023-08-16 MED ORDER — TOPIRAMATE 25 MG PO TABS
25.0000 mg | ORAL_TABLET | Freq: Every day | ORAL | 4 refills | Status: DC
Start: 2023-08-16 — End: 2024-03-19

## 2023-08-16 MED ORDER — HYDROCHLOROTHIAZIDE 12.5 MG PO TABS: ORAL_TABLET | ORAL | 1 refills | Status: DC

## 2023-08-16 MED ORDER — PHENTERMINE HCL 37.5 MG PO CAPS: 37.5000 mg | ORAL_CAPSULE | Freq: Every morning | ORAL | 4 refills | Status: DC

## 2023-08-18 ENCOUNTER — Other Ambulatory Visit: Payer: Self-pay

## 2023-08-18 ENCOUNTER — Other Ambulatory Visit: Payer: Self-pay | Admitting: Internal Medicine

## 2023-08-18 ENCOUNTER — Telehealth: Payer: Self-pay | Admitting: Internal Medicine

## 2023-08-18 DIAGNOSIS — R52 Pain, unspecified: Secondary | ICD-10-CM

## 2023-08-18 NOTE — Telephone Encounter (Signed)
 Phone call placed to patient today after receiving 2 MyChart messages from her.  Patient reported that she was not able to get the Topamax filled at her preferred [facility] pharmacy.  Her insurance was denying the fill because it was showing that it was pending pickup at pharmacy.  This block was removed by having the pharmacy reshelf the medicine since she did not intend to pick up from [place].  I subsequently called [facility] to confirm that the block was removed and that she would be able to pick up the medicine today.  I apologize for the confusion and inconvenience to the patient.

## 2023-08-19 ENCOUNTER — Ambulatory Visit (INDEPENDENT_AMBULATORY_CARE_PROVIDER_SITE_OTHER): Payer: Commercial Managed Care - HMO | Admitting: Primary Care

## 2023-08-19 ENCOUNTER — Telehealth (INDEPENDENT_AMBULATORY_CARE_PROVIDER_SITE_OTHER): Payer: Commercial Managed Care - HMO | Admitting: Primary Care

## 2023-08-19 DIAGNOSIS — H1033 Unspecified acute conjunctivitis, bilateral: Secondary | ICD-10-CM | POA: Diagnosis not present

## 2023-08-19 MED ORDER — OFLOXACIN 0.3 % OP SOLN: 1.0000 [drp] | Freq: Four times a day (QID) | OPHTHALMIC | 0 refills | Status: AC

## 2023-08-19 NOTE — Progress Notes (Signed)
  [facility]  Virtual Visit Note  I connected with Erin Glenn, on 08/19/2023 at 4:56 PM through an audio and video application and verified that I am speaking with the correct person using two identifiers.   Consent: I discussed the limitations, risks, security and privacy concerns of performing an evaluation and management service by mychart and the availability of in person appointments. I also discussed with the patient that there may be a patient responsible charge related to this service. The patient expressed understanding and agreed to proceed.   Location of Patient:  home Location of Provider: [facility]   Persons participating in visit: TONICA BRASINGTON Gwinda Passe,  NP   History of Present Illness: Erin Glenn is a 53 year old female with an acute right eye itching, drainage , wakes up with crusty.    Past Medical History:  Diagnosis Date   Fibroid uterus 2014   Unspecified vitamin D deficiency 09/26/2013   urgent care [facility]   Allergies  Allergen Reactions   Hydrocodone-Acetaminophen Nausea Only   Hydrocodone-Acetaminophen Nausea Only    Current Outpatient Medications on File Prior to Visit  Medication Sig Dispense Refill   hydrochlorothiazide (HYDRODIURIL) 12.5 MG tablet TAKE 2 TABLETS(25 MG) BY MOUTH DAILY 180 tablet 1   meloxicam (MOBIC) 15 MG tablet TAKE 1 TABLET(15 MG) BY MOUTH DAILY prn pain (Patient not taking: Reported on 08/13/2023) 90 tablet 0   phentermine 37.5 MG capsule Take 1 capsule (37.5 mg total) by mouth in the morning. Needs to be seen prior to next refill. 30 capsule 4   topiramate (TOPAMAX) 25 MG tablet Take 1 tablet (25 mg total) by mouth at bedtime. 30 tablet 4   No current facility-administered medications on file prior to visit.    Observations/Objective: hpi  Assessment and Plan: Diagnoses and all orders for this visit:  Acute  conjunctivitis of both eyes, unspecified acute conjunctivitis type  Other orders -     ofloxacin (OCUFLOX) 0.3 % ophthalmic solution; Place 1 drop into the right eye 4 (four) times daily.     Follow Up Instructions: With PCP   I discussed the assessment and treatment plan with the patient. The patient was provided an opportunity to ask questions and all were answered. The patient agreed with the plan and demonstrated an understanding of the instructions.   The patient was advised to call back or seek an in-person evaluation if the symptoms worsen or if the condition fails to improve as anticipated.     I provided 15 minutes total time during this encounter including median intraservice time, reviewing previous notes, investigations, ordering medications, medical decision making, coordinating care and patient verbalized understanding at the end of the visit.    This note has been created with Education officer, environmental. Any transcriptional errors are unintentional.   Grayce Sessions, NP 08/19/2023, 4:56 PM

## 2023-08-24 ENCOUNTER — Other Ambulatory Visit: Payer: Self-pay

## 2023-08-25 ENCOUNTER — Encounter: Payer: Self-pay | Admitting: Internal Medicine

## 2023-08-25 ENCOUNTER — Other Ambulatory Visit: Payer: Self-pay | Admitting: Internal Medicine

## 2023-08-25 DIAGNOSIS — Z1231 Encounter for screening mammogram for malignant neoplasm of breast: Secondary | ICD-10-CM

## 2023-08-31 ENCOUNTER — Ambulatory Visit: Admitting: Nurse Practitioner

## 2023-09-02 ENCOUNTER — Encounter

## 2023-09-16 ENCOUNTER — Ambulatory Visit: Payer: Self-pay

## 2023-09-16 NOTE — Telephone Encounter (Signed)
  Chief Complaint: continued eye symptoms Symptoms: itching, drainage, broken blood vessel Frequency: Constant Pertinent Negatives: Patient denies vision changes Disposition: [] ED /[x] Urgent Care (no appt availability in office) / [] Appointment(In office/virtual)/ []  [facility]/ [] Home Care/ [] Refused Recommended Disposition /[] [facility]/ []  Follow-up with PCP Additional Notes:  Calling for continued eye symptoms, requesting in person office visit today. She was evaluated via virtual exam on 08/19/23, diagnosed with conjunctivitis and prescribed ofloxacin drops. She reports no improvement on eye drops, she has an inflamed or broken vessel near her pupil. Eyes are still itching and watery. Itching is intense. Feels she may have scratched her eye and maybe it was not infected to begin with. Experiencing mild pain. No acute visits available, plans to seek evaluation at [facility] today.   Copied from CRM [id number]. Topic: Clinical - Pink Word Triage >> Sep 16, 2023  3:05 PM Fuller Mandril wrote: Reason for Triage: Eye concerns. Not sure if its a blood vessel. Was seen for it by Gwinda Passe at [facility]. Was prescribed eye drops. Out of drops and do not know if it is the same or worse. Now she also has a bump by eye. Vision not effected that she can tell. Would like to see someone today. Reason for Disposition  [1] Using antibiotic eyedrops > 72 hours (3 days) AND [2] pus in eye persists  Protocols used: Eye - Pus or Discharge-A-AH

## 2023-09-23 ENCOUNTER — Ambulatory Visit
Admission: RE | Admit: 2023-09-23 | Discharge: 2023-09-23 | Disposition: A | Discharge status: Home / Self Care | Source: Ambulatory Visit | Attending: Internal Medicine | Admitting: Internal Medicine

## 2023-09-23 DIAGNOSIS — Z1231 Encounter for screening mammogram for malignant neoplasm of breast: Secondary | ICD-10-CM | POA: Diagnosis present

## 2023-09-24 ENCOUNTER — Encounter: Payer: Self-pay | Admitting: Family Medicine

## 2023-09-30 ENCOUNTER — Other Ambulatory Visit: Payer: Self-pay | Admitting: Internal Medicine

## 2023-09-30 NOTE — Telephone Encounter (Signed)
 Patient refusing nurse triage.  Copied from CRM [id number]. Topic: Clinical - Request for Lab/Test Order >> Sep 30, 2023 12:22 PM Quay Burow wrote: Reason for CRM: PT CALLED STATED HER EYES ARE RED AGAIN, PT REFUSED WHEN I ADVISED I WOULD GET A NURSE TO CONTACT HER BACK. SHE IS REQUESTING A REFILL ON THIS EYE DROP ofloxacin (OCUFLOX) 0.3 % ophthalmic solution. BUT IS WANTING TO BE SEEN TODAY FOR IT. FORWARDING TO CAL.

## 2023-10-05 ENCOUNTER — Ambulatory Visit: Payer: Commercial Managed Care - HMO | Admitting: Internal Medicine

## 2023-10-25 ENCOUNTER — Other Ambulatory Visit: Payer: Self-pay | Admitting: Physician Assistant

## 2023-10-25 DIAGNOSIS — R52 Pain, unspecified: Secondary | ICD-10-CM

## 2023-12-14 ENCOUNTER — Ambulatory Visit: Payer: Commercial Managed Care - HMO | Admitting: Internal Medicine

## 2023-12-29 ENCOUNTER — Other Ambulatory Visit: Payer: Self-pay | Admitting: Internal Medicine

## 2023-12-29 DIAGNOSIS — I10 Essential (primary) hypertension: Secondary | ICD-10-CM

## 2024-02-15 ENCOUNTER — Other Ambulatory Visit: Payer: Self-pay | Admitting: Internal Medicine

## 2024-02-15 NOTE — Telephone Encounter (Signed)
 Copied from CRM #[id number]. Topic: Clinical - Medication Refill >> Feb 15, 2024 11:05 AM Tiffini S wrote: Medication: phentermine  37.5 MG- needs tablets not capsule to break in half   Has the patient contacted their pharmacy? Yes (Agent: If no, request that the patient contact the pharmacy for the refill. If patient does not wish to contact the pharmacy document the reason why and proceed with request.) (Agent: If yes, when and what did the pharmacy advise?)  This is the patient's preferred pharmacy:  [id number] - [place], [place] - [address] AT [facility] OF [address] [place] [address] Phone: [phone] Fax: [phone]  Is this the correct pharmacy for this prescription? Yes If no, delete pharmacy and type the correct one.   Has the prescription been filled recently? Yes  Is the patient out of the medication? Yes  Has the patient been seen for an appointment in the last year OR does the patient have an upcoming appointment? Yes  Can we respond through MyChart? No, please call patient at [phone]  Agent: Please be advised that Rx refills may take up to 3 business days. We ask that you follow-up with your pharmacy.

## 2024-02-17 NOTE — Telephone Encounter (Signed)
 Requested medication (s) are due for refill today: already ordered today   Requested medication (s) are on the active medication list: yes   Last refill:  02/17/24 #30 1 refills  Future visit scheduled: no   Notes to clinic:  not delegated per protocol. Receipt confirmed by pharmacy 02/17/24 at 7:33 am.      Requested Prescriptions  Pending Prescriptions Disp Refills   phentermine  37.5 MG capsule 30 capsule 4    Sig: Take 1 capsule (37.5 mg total) by mouth in the morning. Needs to be seen prior to next refill.     Not Delegated - Neurology: Anticonvulsants - Controlled - phentermine  hydrochloride Failed - 02/17/2024  9:21 AM      Failed - This refill cannot be delegated      Failed - eGFR in normal range and within 360 days    GFR, Est African American  Date Value Ref Range Status  07/09/2015 >89 >=60 mL/min Final   GFR calc Af Amer  Date Value Ref Range Status  10/06/2019 123 >59 mL/min/1.73 Final   GFR, Est Non African American  Date Value Ref Range Status  07/09/2015 >89 >=60 mL/min Final    Comment:      The estimated GFR is a calculation valid for adults (>=60 years old) that uses the CKD-EPI algorithm to adjust for age and sex. It is   not to be used for children, pregnant women, hospitalized patients,    patients on dialysis, or with rapidly changing kidney function. According to the NKDEP, eGFR >89 is normal, 60-89 shows mild impairment, 30-59 shows moderate impairment, 15-29 shows severe impairment and <15 is ESRD.      GFR calc non Af Amer  Date Value Ref Range Status  10/06/2019 107 >59 mL/min/1.73 Final   eGFR  Date Value Ref Range Status  12/15/2022 108 >59 mL/min/1.73 Final         Failed - Cr in normal range and within 360 days    Creat  Date Value Ref Range Status  07/09/2015 0.65 0.50 - 1.10 mg/dL Final   Creatinine, Ser  Date Value Ref Range Status  12/15/2022 0.64 0.57 - 1.00 mg/dL Final   Creatinine, POC  Date Value Ref Range Status   12/17/2016 200 mg/dL Final         Failed - Last BP in normal range    BP Readings from Last 1 Encounters:  08/13/23 (!) 136/96         Failed - Valid encounter within last 6 months    Recent Outpatient Visits           6 months ago Acute conjunctivitis of both eyes, unspecified acute conjunctivitis type   [facility] Celestia Rosaline SQUIBB, NP   6 months ago Class 2 severe obesity due to excess calories with serious comorbidity and body mass index (BMI) of 38.0 to 38.9 in adult [facility])   [facility] - A Dept Of [place]. [facility] Vicci Sober B, MD   1 year ago Class 2 severe obesity with serious comorbidity and body mass index (BMI) of 37.0 to 37.9 in adult, unspecified obesity type [facility])   [facility] - A Dept Of [place]. [facility] Vicci Sober NOVAK, MD   1 year ago Obesity (BMI 35.0-39.9 without comorbidity)   [facility] - A Dept Of [place]. [facility] [facility], [place]  1 year ago Vitamin D deficiency disease   [facility] - A Dept Of [place]. [facility], Jon M, PA-C              Failed - Weight completed in the last 3 months    Wt Readings from Last 1 Encounters:  08/13/23 226 lb (102.5 kg)

## 2024-03-15 ENCOUNTER — Ambulatory Visit: Payer: Self-pay | Admitting: Physician Assistant

## 2024-03-15 ENCOUNTER — Encounter: Payer: Self-pay | Admitting: Physician Assistant

## 2024-03-15 ENCOUNTER — Ambulatory Visit: Payer: Self-pay

## 2024-03-15 VITALS — BP 132/83 | HR 98 | Ht 65.0 in | Wt 220.0 lb

## 2024-03-15 DIAGNOSIS — H9201 Otalgia, right ear: Secondary | ICD-10-CM

## 2024-03-15 LAB — AMB RESULTS CONSOLE CBG: Glucose: 116

## 2024-03-15 MED ORDER — FLUTICASONE PROPIONATE 50 MCG/ACT NA SUSP: 2.0000 | Freq: Every day | NASAL | 6 refills | Status: AC

## 2024-03-15 MED ORDER — CETIRIZINE HCL 10 MG PO TABS: 10.0000 mg | ORAL_TABLET | Freq: Every day | ORAL | 11 refills | Status: AC

## 2024-03-15 NOTE — Telephone Encounter (Signed)
 FYI Only or Action Required?: FYI only for provider.  Patient was last seen in primary care on 08/19/2023 by Celestia Rosaline SQUIBB, NP.  Called Nurse Triage reporting Otalgia.  Symptoms began today.  Interventions attempted: Other: Peroxide and q tips.  Symptoms are: gradually worsening.  Triage Disposition: See Physician Within 24 Hours  Patient/caregiver understands and will follow disposition?: Yes    Copied from CRM #[id number]. Topic: Clinical - Red Word Triage >> Mar 15, 2024  2:24 PM Fonda T wrote: Red Word that prompted transfer to Nurse Triage: Received call from patient, reports she is  having right ear pain, initally started as itching now she is having pain in her ear and wants to be checked. Reason for Disposition  Earache  (Exceptions: Brief ear pain of lasting less than 60 minutes, or earache occurring during air travel.)  Answer Assessment - Initial Assessment Questions Patient states she feels like something is in her ear and it hurts to touch her ear as well. Denies dizziness or headache. Patient offered an appointment at urgent and declined. Patient given information for the [facility] and states she will go there to receive care.   1. LOCATION: Which ear is involved?     R ear pain  2. ONSET: When did the ear pain start?      This morning  3. SEVERITY: How bad is the pain?  (Scale 1-10; mild, moderate or severe)     8/10 4. URI SYMPTOMS: Do you have a runny nose or cough?     Denies  5. FEVER: Do you have a fever? If Yes, ask: What is your temperature, how was it measured, and when did it start?     Denies  6. CAUSE: Have you been swimming recently?, How often do you use Q-TIPS?, Have you had any recent air travel or scuba diving?     Used a q tip and peroxide in ear  7. OTHER SYMPTOMS: Do you have any other symptoms? (e.g., decreased hearing, dizziness, headache, stiff neck, vomiting)     Itching on outside of ear, decreased  hearing  Protocols used: Earache-A-AH

## 2024-03-15 NOTE — Progress Notes (Unsigned)
 Established Patient Office Visit  Subjective   Patient ID: Erin Glenn, female    DOB: [birth year]  Age: 53 y.o. MRN: [id number]  Chief Complaint  Patient presents with   Ear Pain    Patient c/o right ear pain. It started with itching and after she put peroxide in it. It became sore  Discussed the use of AI scribe software for clinical note transcription with the patient, who gave verbal consent to proceed.  History of Present Illness   Erin Glenn is a 53 year old female who presents with right ear pain and itching.  She has experienced right ear pain and itching since this morning. Initially, itching led her to apply peroxide, which resulted in pain. She used a Q-tip but was not aggressive. There is no fever or sinus pain. She has difficulty hearing in the right ear, describing it as 'stuffed up'. She has not taken Tylenol  or ibuprofen , suspecting peroxide as the cause. She denies using allergy medications. The itching persists, especially on the outer ear. She has not used air pods or earbuds. The left ear is asymptomatic, and she has not applied peroxide there. The right ear is sore and painful when she smiles.     Past Medical History:  Diagnosis Date   Fibroid uterus 2014   Unspecified vitamin D deficiency 09/26/2013   urgent care [facility]   Social History   Socioeconomic History   Marital status: Married    Spouse name: Not on file   Number of children: Not on file   Years of education: Not on file   Highest education level: Not on file  Occupational History   Not on file  Tobacco Use   Smoking status: Never   Smokeless tobacco: Never  Substance and Sexual Activity   Alcohol use: No   Drug use: No   Sexual activity: Yes    Birth control/protection: None    Comment: G0 P0  Other Topics Concern   Not on file  Social History Narrative   Not on file   Social Drivers of Health   Financial Resource Strain: Low Risk  (05/20/2021)   Received  from [facility]   Overall Financial Resource Strain (CARDIA)    Difficulty of Paying Living Expenses: Not hard at all  Food Insecurity: No Food Insecurity (03/15/2024)   Hunger Vital Sign    Worried About Running Out of Food in the Last Year: Never true    Ran Out of Food in the Last Year: Never true  Transportation Needs: No Transportation Needs (03/15/2024)   PRAPARE - Administrator, Civil Service (Medical): No    Lack of Transportation (Non-Medical): No  Physical Activity: Insufficiently Active (05/20/2021)   Received from [facility]   Exercise Vital Sign    On average, how many days per week do you engage in moderate to strenuous exercise (like a brisk walk)?: 2 days    On average, how many minutes do you engage in exercise at this level?: 60 min  Stress: No Stress Concern Present (05/20/2021)   Received from [facility] of Occupational Health - Occupational Stress Questionnaire    Feeling of Stress : Not at all  Social Connections: Unknown (10/31/2021)   [facility] from [facility]   Social Network    Social Network: Not on file  Intimate Partner Violence: Not At Risk (03/15/2024)   Humiliation, Afraid, Rape, and Kick questionnaire    Fear of  Current or Ex-Partner: No    Emotionally Abused: No    Physically Abused: No    Sexually Abused: No   Family History  Problem Relation Age of Onset   Diabetic kidney disease Mother    Hypertension Mother    Stroke Mother    Heart disease Mother    Hyperlipidemia Mother    Diabetes Mother    Alcoholism Father    Hypertension Father    Cancer - Other Father 17       throat   Diabetes Father    Breast cancer Neg Hx    Allergies  Allergen Reactions   Hydrocodone -Acetaminophen  Nausea Only   Hydrocodone -Acetaminophen  Nausea Only    Review of Systems  Constitutional:  Negative for chills and fever.  HENT:  Positive for ear pain and hearing loss. Negative for ear discharge, sinus pain and  sore throat.   Eyes: Negative.   Respiratory:  Negative for shortness of breath.   Cardiovascular:  Negative for chest pain.  Gastrointestinal: Negative.   Genitourinary: Negative.   Musculoskeletal: Negative.   Skin: Negative.   Neurological: Negative.   Endo/Heme/Allergies: Negative.   Psychiatric/Behavioral: Negative.        Objective:     BP 132/83 (BP Location: Left Arm, Patient Position: Sitting, Cuff Size: Large)   Pulse 98   Ht 5' 5 (1.651 m)   Wt 220 lb (99.8 kg)   LMP 05/16/2016   SpO2 94%   BMI 36.61 kg/m  BP Readings from Last 3 Encounters:  03/15/24 132/83  03/15/24 127/83  08/13/23 (!) 136/96   Wt Readings from Last 3 Encounters:  03/15/24 220 lb (99.8 kg)  08/13/23 226 lb (102.5 kg)  03/11/23 237 lb (107.5 kg)    Physical Exam Vitals and nursing note reviewed.  Constitutional:      Appearance: Normal appearance.  HENT:     Head: Normocephalic and atraumatic.     Right Ear: Tympanic membrane, ear canal and external ear normal. Tenderness present. No middle ear effusion. There is no impacted cerumen. Tympanic membrane is not injected or erythematous.     Left Ear: Tympanic membrane, ear canal and external ear normal. Tympanic membrane is not injected or erythematous.     Nose: Nose normal.     Mouth/Throat:     Pharynx: Oropharynx is clear.  Eyes:     Extraocular Movements: Extraocular movements intact.     Conjunctiva/sclera: Conjunctivae normal.     Pupils: Pupils are equal, round, and reactive to light.  Cardiovascular:     Rate and Rhythm: Normal rate and regular rhythm.     Pulses: Normal pulses.     Heart sounds: Normal heart sounds.  Pulmonary:     Effort: Pulmonary effort is normal.     Breath sounds: Normal breath sounds.  Musculoskeletal:        General: Normal range of motion.     Cervical back: Normal range of motion and neck supple.  Skin:    General: Skin is warm and dry.  Neurological:     General: No focal deficit present.      Mental Status: She is alert and oriented to person, place, and time.  Psychiatric:        Mood and Affect: Mood normal.        Behavior: Behavior normal.        Thought Content: Thought content normal.        Judgment: Judgment normal.      Assessment &  Plan:   Problem List Items Addressed This Visit   None Visit Diagnoses       Right ear pain    -  Primary   Relevant Medications   cetirizine  (ZYRTEC  ALLERGY) 10 MG tablet   fluticasone  (FLONASE ) 50 MCG/ACT nasal spray     1. Right ear pain (Primary) Trial Zyrtec , Flonase .  Patient education given on supportive care.  Red flags given for prompt reevaluation - cetirizine  (ZYRTEC  ALLERGY) 10 MG tablet; Take 1 tablet (10 mg total) by mouth daily.  Dispense: 30 tablet; Refill: 11 - fluticasone  (FLONASE ) 50 MCG/ACT nasal spray; Place 2 sprays into both nostrils daily.  Dispense: 16 g; Refill: 6   I have reviewed the patient's medical history (PMH, PSH, Social History, Family History, Medications, and allergies) , and have been updated if relevant. I spent 30 minutes reviewing chart and  face to face time with patient.     Return if symptoms worsen or fail to improve.    Kirk RAMAN Mayers, PA-C

## 2024-03-15 NOTE — Telephone Encounter (Signed)
 noted

## 2024-03-15 NOTE — Patient Instructions (Signed)
 Please start zyrtec  and flonase  once daily, please let us  know if there is anything else we can do for you  Kirk CANDIE Sage, PA-C Physician Assistant [facility] [email or web address]   Earache, Adult An earache, or ear pain, can be caused by many things, including: An infection. Ear wax buildup. Ear pressure. Something in the ear that should not be there (foreign body). A sore throat. Tooth problems. Jaw problems. Treatment of the earache will depend on the cause. If the cause is not clear or cannot be known, you may need to watch your symptoms until your earache goes away or until a cause is found. Follow these instructions at home: Medicines Take or apply over-the-counter and prescription medicines only as told by your health care provider. If you were prescribed antibiotics, use them as told by your health care provider. Do not stop using the antibiotic even if you start to feel better. Do not put anything in your ear other than medicine that is prescribed by your health care provider. Managing pain     If directed, apply heat to the affected area as often as told by your health care provider. Use the heat source that your health care provider recommends, such as a moist heat pack or a heating pad. Place a towel between your skin and the heat source. Leave the heat on for 20-30 minutes. If your skin turns bright red, remove the heat right away to prevent burns. The risk of burns is higher if you cannot feel pain, heat, or cold. If directed, put ice on the affected area. To do this: Put ice in a plastic bag. Place a towel between your skin and the bag. Leave the ice on for 20 minutes, 2-3 times a day. If your skin turns bright red, remove the ice right away to prevent skin damage. The risk of skin damage is higher if you cannot feel pain, heat, or cold.  General instructions Pay attention to any changes in your  symptoms. Try resting in an upright position instead of lying down. This may help to reduce pressure in your ear and relieve pain. Chew gum if it helps to relieve your ear pain. Treat any allergies as told by your health care provider. Drink enough fluid to keep your urine pale yellow. It is up to you to get the results of any tests that were done. Ask your health care provider, or the department that is doing the tests, when your results will be ready. Contact a health care provider if: Your pain does not improve within 2 days. Your earache gets worse. You have new symptoms. You have a fever. Get help right away if: You have a severe headache. You have a stiff neck. You have trouble swallowing. You have redness or swelling behind your ear. You have fluid or blood coming from your ear. You have hearing loss. You feel dizzy. This information is not intended to replace advice given to you by your health care provider. Make sure you discuss any questions you have with your health care provider. Document Revised: 10/20/2021 Document Reviewed: 10/20/2021 Elsevier Patient Education  2024 [employer].

## 2024-03-17 ENCOUNTER — Other Ambulatory Visit: Payer: Self-pay | Admitting: Internal Medicine

## 2024-03-17 ENCOUNTER — Telehealth: Payer: Self-pay

## 2024-03-17 DIAGNOSIS — E66812 Obesity, class 2: Secondary | ICD-10-CM

## 2024-03-17 NOTE — Telephone Encounter (Signed)
 Copied from CRM #[id number]. Topic: Clinical - Medication Refill >> Mar 17, 2024 12:41 PM Santiya F wrote: Medication: topiramate  (TOPAMAX ) 25 MG tablet [[id number]], phentermine  37.5 MG capsule [[id number]]  Has the patient contacted their pharmacy? Yes  (Agent: If yes, when and what did the pharmacy advise?)contact office   This is the patient's preferred pharmacy:  [id number] - [place], [place] - [address] AT [facility] OF [address] & [address] [place] [address] Phone: [phone] Fax: [phone]  Is this the correct pharmacy for this prescription? Yes If no, delete pharmacy and type the correct one.   Has the prescription been filled recently? Yes  Is the patient out of the medication? Yes  Has the patient been seen for an appointment in the last year OR does the patient have an upcoming appointment? Yes  Can we respond through MyChart? No  Agent: Please be advised that Rx refills may take up to 3 business days. We ask that you follow-up with your pharmacy.

## 2024-03-17 NOTE — Telephone Encounter (Signed)
 Copied from CRM #[id number]. Topic: Clinical - Prescription Issue >> Mar 17, 2024  4:43 PM Everette C wrote: Reason for CRM: The patient has called to follow up on their previous refill request of topiramate  (TOPAMAX ) 25 MG tablet [[id number]]. Please contact the patient further if/when possible

## 2024-03-19 MED ORDER — TOPIRAMATE 25 MG PO TABS: 25.0000 mg | ORAL_TABLET | Freq: Every day | ORAL | 0 refills | Status: DC

## 2024-03-19 NOTE — Telephone Encounter (Signed)
 Pt has no showed appt in June and cancelled appt for October.  1 refill sent. Needs to be seen prior to next RF request. .

## 2024-03-20 ENCOUNTER — Telehealth: Payer: Self-pay | Admitting: Internal Medicine

## 2024-03-20 MED ORDER — PHENTERMINE HCL 37.5 MG PO CAPS: 37.5000 mg | ORAL_CAPSULE | Freq: Every day | ORAL | 0 refills | Status: DC

## 2024-03-20 NOTE — Telephone Encounter (Signed)
 Requested medication (s) are due for refill today: Yes  Requested medication (s) are on the active medication list: Yes  Last refill:  02/17/24  Future visit scheduled: Yes  Notes to clinic:  Not delegated.    Requested Prescriptions  Pending Prescriptions Disp Refills   phentermine  37.5 MG capsule 30 capsule 1     Not Delegated - Neurology: Anticonvulsants - Controlled - phentermine  hydrochloride Failed - 03/20/2024  1:56 PM      Failed - This refill cannot be delegated      Failed - eGFR in normal range and within 360 days    GFR, Est African American  Date Value Ref Range Status  07/09/2015 >89 >=60 mL/min Final   GFR calc Af Amer  Date Value Ref Range Status  10/06/2019 123 >59 mL/min/1.73 Final   GFR, Est Non African American  Date Value Ref Range Status  07/09/2015 >89 >=60 mL/min Final    Comment:      The estimated GFR is a calculation valid for adults (>=77 years old) that uses the CKD-EPI algorithm to adjust for age and sex. It is   not to be used for children, pregnant women, hospitalized patients,    patients on dialysis, or with rapidly changing kidney function. According to the NKDEP, eGFR >89 is normal, 60-89 shows mild impairment, 30-59 shows moderate impairment, 15-29 shows severe impairment and <15 is ESRD.      GFR calc non Af Amer  Date Value Ref Range Status  10/06/2019 107 >59 mL/min/1.73 Final   eGFR  Date Value Ref Range Status  12/15/2022 108 >59 mL/min/1.73 Final         Failed - Cr in normal range and within 360 days    Creat  Date Value Ref Range Status  07/09/2015 0.65 0.50 - 1.10 mg/dL Final   Creatinine, Ser  Date Value Ref Range Status  12/15/2022 0.64 0.57 - 1.00 mg/dL Final   Creatinine, POC  Date Value Ref Range Status  12/17/2016 200 mg/dL Final         Failed - Valid encounter within last 6 months    Recent Outpatient Visits           7 months ago Acute conjunctivitis of both eyes, unspecified acute  conjunctivitis type   [place] [facility] Celestia Rosaline SQUIBB, NP   7 months ago Class 2 severe obesity due to excess calories with serious comorbidity and body mass index (BMI) of 38.0 to 38.9 in adult   [facility] [place] - A Dept Of [place]. [facility] Vicci Sober B, MD   1 year ago Class 2 severe obesity with serious comorbidity and body mass index (BMI) of 37.0 to 37.9 in adult, unspecified obesity type   [place] [facility] - A Dept Of [place]. [facility] Vicci Sober NOVAK, MD   1 year ago Obesity (BMI 35.0-39.9 without comorbidity)   [place] [facility] - A Dept Of [place]. [facility] Waldron Pac, FNP   1 year ago Vitamin D deficiency disease   [place] [facility] - A Dept Of [place]. [facility], Jon M, [place]              Passed - Last BP in normal range    BP Readings from Last 1 Encounters:  03/15/24 132/83         Passed - Weight completed in the  last 3 months    Wt Readings from Last 1 Encounters:  03/15/24 220 lb (99.8 kg)

## 2024-03-20 NOTE — Telephone Encounter (Signed)
 Called but no answer. LVM to call back.

## 2024-03-20 NOTE — Telephone Encounter (Signed)
 RF sent on Topamax  and Phentermine . Needs to be seen prior to next refill request.

## 2024-03-21 NOTE — Telephone Encounter (Signed)
 Noted! Thank you

## 2024-03-21 NOTE — Telephone Encounter (Addendum)
 Hi Clarisa,   I called the patient, she stated that her insurance will not be active until November and she will contact the office to schedule an appointment at that time.

## 2024-03-28 ENCOUNTER — Ambulatory Visit: Admitting: Internal Medicine

## 2024-03-31 ENCOUNTER — Ambulatory Visit: Admitting: Internal Medicine

## 2024-04-13 NOTE — Progress Notes (Signed)
 Pt attended 03/15/2024 screening event with BP of 127/83 and blood sugar was 116. Pt noted at event that she does have a PCP. At event pt did not indicate any SDOH needs. Pt also noted that she is not a smoker and listed Private as her insurance at the event.   Per initial f/u pt was reached out via phone and was left vm. Pt was also mailed abnormal results letter per initial f/u.   Per chart review pt does have a PCP [facility] WENDI Louder; [facility]), insurance, and is a smoker. Pt's last appt with PCP was 08/13/2023. Pt does not indicate any SDOH needs at this time.  No additional pt f/u to be scheduled at this time per health equity protocol.

## 2024-04-18 ENCOUNTER — Encounter (HOSPITAL_COMMUNITY): Payer: Self-pay | Admitting: Emergency Medicine

## 2024-04-18 ENCOUNTER — Emergency Department (HOSPITAL_COMMUNITY)
Admission: EM | Admit: 2024-04-18 | Discharge: 2024-04-18 | Disposition: A | Discharge status: Home / Self Care | Payer: Self-pay | Attending: Emergency Medicine | Admitting: Emergency Medicine

## 2024-04-18 ENCOUNTER — Other Ambulatory Visit: Payer: Self-pay

## 2024-04-18 ENCOUNTER — Emergency Department (HOSPITAL_COMMUNITY): Payer: Self-pay

## 2024-04-18 DIAGNOSIS — J189 Pneumonia, unspecified organism: Secondary | ICD-10-CM

## 2024-04-18 DIAGNOSIS — Z79899 Other long term (current) drug therapy: Secondary | ICD-10-CM | POA: Insufficient documentation

## 2024-04-18 DIAGNOSIS — I10 Essential (primary) hypertension: Secondary | ICD-10-CM | POA: Insufficient documentation

## 2024-04-18 DIAGNOSIS — J181 Lobar pneumonia, unspecified organism: Secondary | ICD-10-CM | POA: Insufficient documentation

## 2024-04-18 MED ORDER — ALBUTEROL SULFATE HFA 108 (90 BASE) MCG/ACT IN AERS
4.0000 | INHALATION_SPRAY | Freq: Once | RESPIRATORY_TRACT | Status: AC
  Administered 2024-04-18: 4 via RESPIRATORY_TRACT
  Filled 2024-04-18: qty 6.7

## 2024-04-18 MED ORDER — AZITHROMYCIN 250 MG PO TABS: 250.0000 mg | ORAL_TABLET | Freq: Every day | ORAL | 0 refills | Status: AC

## 2024-04-18 MED ORDER — ALBUTEROL SULFATE HFA 108 (90 BASE) MCG/ACT IN AERS: 1.0000 | INHALATION_SPRAY | Freq: Four times a day (QID) | RESPIRATORY_TRACT | 0 refills | Status: AC | PRN

## 2024-04-18 MED ORDER — AMOXICILLIN-POT CLAVULANATE 875-125 MG PO TABS: 1.0000 | ORAL_TABLET | Freq: Two times a day (BID) | ORAL | 0 refills | Status: AC

## 2024-04-18 MED ORDER — BENZONATATE 100 MG PO CAPS
100.0000 mg | ORAL_CAPSULE | Freq: Once | ORAL | Status: AC
  Administered 2024-04-18: 100 mg via ORAL
  Filled 2024-04-18: qty 1

## 2024-04-18 MED ORDER — BENZONATATE 100 MG PO CAPS: 100.0000 mg | ORAL_CAPSULE | Freq: Three times a day (TID) | ORAL | 0 refills | Status: AC

## 2024-04-18 MED ORDER — PREDNISONE 20 MG PO TABS: 40.0000 mg | ORAL_TABLET | Freq: Every day | ORAL | 0 refills | Status: AC

## 2024-04-18 MED ORDER — PREDNISONE 20 MG PO TABS
60.0000 mg | ORAL_TABLET | Freq: Once | ORAL | Status: AC
  Administered 2024-04-18: 60 mg via ORAL
  Filled 2024-04-18: qty 3

## 2024-04-18 NOTE — ED Notes (Signed)
 Patient refused Covid test and EKG

## 2024-04-18 NOTE — ED Provider Notes (Signed)
 [facility] AT [facility] Provider Note   CSN: [id number] Arrival date & time: 04/18/24  9276     Patient presents with: Cough   Erin Glenn is a 53 y.o. female with a past medical history significant for hypertension who presents to the ED due to nasal congestion and cough that started this morning.  Patient notes she was diagnosed with a URI 2 weeks ago.  Denies chest pain and shortness of breath.  No lower extremity edema.  Patient also admits to intermittent wheeze.  No history of asthma or COPD.  Denies tobacco use.  Notes she gets bronchitis yearly with viral infections.  Requesting a refill on her albuterol  inhaler.  Denies fever and chills.  Denies nausea, vomiting, and diarrhea.  History obtained from patient and past medical records. No interpreter used during encounter.       Prior to Admission medications   Medication Sig Start Date End Date Taking? Authorizing Provider  albuterol  (VENTOLIN  HFA) 108 (90 Base) MCG/ACT inhaler Inhale 1-2 puffs into the lungs every 6 (six) hours as needed for wheezing or shortness of breath. 04/18/24  Yes Malcom Selmer C, PA-C  amoxicillin-clavulanate (AUGMENTIN) 875-125 MG tablet Take 1 tablet by mouth every 12 (twelve) hours. 04/18/24  Yes Cashis Rill C, PA-C  azithromycin (ZITHROMAX) 250 MG tablet Take 1 tablet (250 mg total) by mouth daily. Take first 2 tablets together, then 1 every day until finished. 04/18/24  Yes Fayette Gasner C, PA-C  benzonatate (TESSALON) 100 MG capsule Take 1 capsule (100 mg total) by mouth every 8 (eight) hours. 04/18/24  Yes Amor Hyle, Aleck BROCKS, PA-C  predniSONE (DELTASONE) 20 MG tablet Take 2 tablets (40 mg total) by mouth daily for 5 days. 04/18/24 04/23/24 Yes Jahmil Macleod, Aleck BROCKS, PA-C  cetirizine  (ZYRTEC  ALLERGY) 10 MG tablet Take 1 tablet (10 mg total) by mouth daily. 03/15/24   Mayers, Cari S, PA-C  fluticasone  (FLONASE ) 50 MCG/ACT nasal spray Place 2 sprays into  both nostrils daily. 03/15/24   Mayers, Cari S, PA-C  hydrochlorothiazide  (HYDRODIURIL ) 12.5 MG tablet TAKE 2 TABLETS(25 MG) BY MOUTH DAILY 12/30/23   Vicci Barnie NOVAK, MD  meloxicam  (MOBIC ) 15 MG tablet TAKE 1 TABLET(15 MG) BY MOUTH DAILY AS NEEDED FOR PAIN 10/27/23   Vicci Barnie NOVAK, MD  ofloxacin  (OCUFLOX ) 0.3 % ophthalmic solution Place 1 drop into the right eye 4 (four) times daily. 08/19/23   Celestia Rosaline SQUIBB, NP  phentermine  37.5 MG capsule Take 1 capsule (37.5 mg total) by mouth daily. Needs to be seen prior to next refill request. 03/20/24   Vicci Barnie NOVAK, MD  topiramate  (TOPAMAX ) 25 MG tablet Take 1 tablet (25 mg total) by mouth at bedtime. Needs appointment prior to next refill request. 03/19/24   Vicci Barnie NOVAK, MD    Allergies: Hydrocodone -acetaminophen  and Hydrocodone -acetaminophen     Review of Systems  Constitutional:  Negative for chills and fever.  HENT:  Positive for congestion.   Respiratory:  Positive for cough. Negative for shortness of breath.   Cardiovascular:  Negative for chest pain.    Updated Vital Signs BP (!) 135/101 (BP Location: Left Arm)   Pulse 72   Temp 98.2 F (36.8 C) (Oral)   Resp 18   LMP 05/16/2016   SpO2 99%   Physical Exam Vitals and nursing note reviewed.  Constitutional:      General: She is not in acute distress.    Appearance: She is not ill-appearing.  HENT:  Head: Normocephalic.  Eyes:     Pupils: Pupils are equal, round, and reactive to light.  Cardiovascular:     Rate and Rhythm: Normal rate and regular rhythm.     Pulses: Normal pulses.     Heart sounds: Normal heart sounds. No murmur heard.    No friction rub. No gallop.  Pulmonary:     Effort: Pulmonary effort is normal.     Breath sounds: Wheezing present.     Comments: Slight expiratory wheeze. No lower extremity edema Abdominal:     General: Abdomen is flat. There is no distension.     Palpations: Abdomen is soft.     Tenderness: There is no abdominal  tenderness. There is no guarding or rebound.  Musculoskeletal:        General: Normal range of motion.     Cervical back: Neck supple.  Skin:    General: Skin is warm and dry.  Neurological:     General: No focal deficit present.     Mental Status: She is alert.  Psychiatric:        Mood and Affect: Mood normal.        Behavior: Behavior normal.     (all labs ordered are listed, but only abnormal results are displayed) Labs Reviewed  RESP PANEL BY RT-PCR (RSV, FLU A&B, COVID)  [id number]    EKG: None  Radiology: DG Chest 2 View Result Date: 04/18/2024 CLINICAL DATA:  Cough and congestion. Treated for upper respiratory infection 2 weeks ago. EXAM: CHEST - 2 VIEW COMPARISON:  07/16/2016 FINDINGS: Lungs are somewhat hypoinflated demonstrate airspace consolidation over the right upper lobe compatible with a pneumonia. Remainder of the lungs are clear. No effusion. Mediastinal silhouette and remainder the exam is unchanged. IMPRESSION: Right upper lobe pneumonia. Electronically Signed   By: Toribio Agreste M.D.   On: 04/18/2024 07:58     Procedures   Medications Ordered in the ED  albuterol  (VENTOLIN  HFA) 108 (90 Base) MCG/ACT inhaler 4 puff (4 puffs Inhalation Given 04/18/24 0757)  benzonatate (TESSALON) capsule 100 mg (100 mg Oral Given by Other 04/18/24 0825)  predniSONE (DELTASONE) tablet 60 mg (60 mg Oral Given 04/18/24 0757)                                    Medical Decision Making Amount and/or Complexity of Data Reviewed Radiology: ordered and independent interpretation performed. Decision-making details documented in ED Course.  Risk Prescription drug management.   This patient presents to the ED for concern of cough, this involves an extensive number of treatment options, and is a complaint that carries with it a high risk of complications and morbidity.  The differential diagnosis includes viral process, PNA, PE, ACS, etc  53 year old female presents to the ED due  to cough and intermittent wheeze that started this morning.  No history of asthma/COPD.  Notes she typically gets bronchitis yearly.  No history of blood clots, recent surgeries or recent long mobilizations.  No lower extremity edema.  Upon arrival, stable vitals.  Patient well-appearing on exam.  Does have very slight expiratory wheeze.  No lower extremity edema.  Chest x-ray ordered to rule out pneumonia.  Tessalon Perles, steroids, and albuterol  given. Low suspicion for PE/DVT. No chest pain. Low suspicion for ACS.   X-ray personally reviewed and interpreted which demonstrates right upper lobe pneumonia.  Patient well-appearing on exam.  Normal O2 saturation. patient  discharged with antibiotics for pneumonia.  Patient declined EKG and COVID test.  No evidence of respiratory distress on exam.  Patient ambulated with O2 saturation above 95% without difficulties.  Given wheeze, patient also discharged with short course of prednisone.  Patient stable for discharge. Strict ED precautions discussed with patient. Patient states understanding and agrees to plan. Patient discharged home in no acute distress and stable vitals  Has PCP Hx HTN     Final diagnoses:  Community acquired pneumonia of right upper lobe of lung    ED Discharge Orders          Ordered    amoxicillin-clavulanate (AUGMENTIN) 875-125 MG tablet  Every 12 hours        04/18/24 0838    azithromycin (ZITHROMAX) 250 MG tablet  Daily        04/18/24 0838    predniSONE (DELTASONE) 20 MG tablet  Daily        04/18/24 0838    albuterol  (VENTOLIN  HFA) 108 (90 Base) MCG/ACT inhaler  Every 6 hours PRN        04/18/24 0838    benzonatate (TESSALON) 100 MG capsule  Every 8 hours        04/18/24 0839               Lorelle Aleck BROCKS, PA-C 04/18/24 0843    Elnor Jayson LABOR, DO 04/20/24 [phone]

## 2024-04-18 NOTE — ED Triage Notes (Signed)
 Pt reports cough and congestion. Pt reports she was treated for URI 2 weeks ago.

## 2024-04-18 NOTE — Discharge Instructions (Addendum)
 It was a pleasure taking care of you today.  As discussed, your chest x-ray showed evidence of pneumonia.  I am sending you home with 2 different antibiotics.  Take as prescribed and finish all antibiotics.  I am also sending home you with steroids.  Take for the next 5 days.  Cough medication as needed.  Please follow-up with PCP if symptoms do not improve over the next few days.  Return to the ER for any worsening symptoms.

## 2024-05-02 ENCOUNTER — Other Ambulatory Visit: Payer: Self-pay

## 2024-05-02 DIAGNOSIS — E66812 Obesity, class 2: Secondary | ICD-10-CM

## 2024-05-02 NOTE — Telephone Encounter (Signed)
 Spoke with patient to clarify the medication she was requesting for refill. Patient confirmed the medication is Phentermine  37.5 mg. She stated she was seen by Kirk Gleason on 03/15/2024. Patient reported she is uninsured and pays out of pocket for all expenses. She expressed that she does not have the funds for another office visit. I advised that I would forward the request to the provider for review and explained that the decision to refill is at the provider's discretion. Patient verbalized understanding and expressed appreciation.

## 2024-05-02 NOTE — Telephone Encounter (Signed)
 Copied from CRM [id number]. Topic: General - Other >> May 02, 2024 10:55 AM Berwyn MATSU wrote: Reason for CRM: patient called in to ask why she needs to be seen for refills if she was seen at the mobile clinic 03/15/24 and is not sure why she would need to be seen to for medications. Patient called in upset and said she does not have insurance and can not afford to be seen again.   May you please assist.

## 2024-05-03 ENCOUNTER — Telehealth: Payer: Self-pay | Admitting: Internal Medicine

## 2024-05-03 DIAGNOSIS — Z6838 Body mass index (BMI) 38.0-38.9, adult: Secondary | ICD-10-CM

## 2024-05-03 MED ORDER — PHENTERMINE HCL 37.5 MG PO CAPS: 37.5000 mg | ORAL_CAPSULE | Freq: Every day | ORAL | 1 refills | Status: DC

## 2024-05-03 NOTE — Telephone Encounter (Signed)
 Call to patient to discuss PCP recommendations . VM full unable to leave message. SMS sent.

## 2024-05-03 NOTE — Telephone Encounter (Signed)
 Left message on voicemail to return call.

## 2024-05-03 NOTE — Telephone Encounter (Signed)
 Patient requesting refill on phentermine . See message copied below from RN Calton Daring to me. Let pt know last seen by me 07/2023 for monitoring while on Phentermine .  Appointment with the mobile unit in September was for an acute visit. Let patient know that I will extend the refill on the phentermine  for 2 months after which no further refills will be given without being seen by me. Consider applying for Medicaid or [facility] discount if uninsured.   Calton Daring 05/02/24: Spoke with patient to clarify the medication she was requesting for refill. Patient confirmed the medication is Phentermine  37.5 mg. She stated she was seen by Kirk Gleason on 03/15/2024. Patient reported she is uninsured and pays out of pocket for all expenses. She expressed that she does not have the funds for another office visit. I advised that I would forward the request to the provider for review and explained that the decision to refill is at the provider's discretion. Patient verbalized understanding and expressed appreciation.

## 2024-05-21 ENCOUNTER — Other Ambulatory Visit: Payer: Self-pay | Admitting: Internal Medicine

## 2024-05-21 DIAGNOSIS — I10 Essential (primary) hypertension: Secondary | ICD-10-CM

## 2024-05-23 ENCOUNTER — Telehealth: Payer: Self-pay | Admitting: Internal Medicine

## 2024-05-23 NOTE — Telephone Encounter (Signed)
 Copied from CRM #[id number]. Topic: Clinical - Prescription Issue >> May 22, 2024  3:24 PM Montie POUR wrote: Reason for CRM:  Jerzy is very upset that she cannot get her hydrochlorothiazide  (HYDRODIURIL ) 12.5 MG tablet refilled without an office visit. She will not have insurance until January 2026. Please call her at [phone] to discuss. Thanks

## 2024-05-23 NOTE — Telephone Encounter (Signed)
 hydrochlorothiazide  (HYDRODIURIL ) 12.5 MG tablet refill sent

## 2024-06-26 ENCOUNTER — Other Ambulatory Visit: Payer: Self-pay | Admitting: Internal Medicine

## 2024-06-26 DIAGNOSIS — Z6838 Body mass index (BMI) 38.0-38.9, adult: Secondary | ICD-10-CM

## 2024-06-26 DIAGNOSIS — I10 Essential (primary) hypertension: Secondary | ICD-10-CM

## 2024-06-26 NOTE — Telephone Encounter (Signed)
 Copied from CRM [id number]. Topic: Clinical - Medication Refill >> Jun 26, 2024 11:00 AM Eva FALCON wrote: Medication: hydrochlorothiazide  (HYDRODIURIL ) 12.5 MG tablet phentermine  37.5 MG capsule   Has the patient contacted their pharmacy? Yes, no refills (Agent: If no, request that the patient contact the pharmacy for the refill. If patient does not wish to contact the pharmacy document the reason why and proceed with request.) (Agent: If yes, when and what did the pharmacy advise?)  This is the patient's preferred pharmacy:  [id number] - [place], [place] - [address] AT [facility] OF [address] [phone] [address] [place] [phone]    Is this the correct pharmacy for this prescription? Yes If no, delete pharmacy and type the correct one.   Has the prescription been filled recently? Yes  Is the patient out of the medication? Yes  Has the patient been seen for an appointment in the last year OR does the patient have an upcoming appointment? Yes  Can we respond through MyChart? Yes  Agent: Please be advised that Rx refills may take up to 3 business days. We ask that you follow-up with your pharmacy.

## 2024-06-27 NOTE — Telephone Encounter (Signed)
 Requested Prescriptions  Pending Prescriptions Disp Refills   phentermine  37.5 MG capsule 30 capsule 1    Sig: Take 1 capsule (37.5 mg total) by mouth daily. Needs to be seen prior to next refill request.     Not Delegated - Neurology: Anticonvulsants - Controlled - phentermine  hydrochloride Failed - 06/27/2024  3:39 PM      Failed - This refill cannot be delegated      Failed - eGFR in normal range and within 360 days    GFR, Est African American  Date Value Ref Range Status  07/09/2015 >89 >=60 mL/min Final   GFR calc Af Amer  Date Value Ref Range Status  10/06/2019 123 >59 mL/min/1.73 Final   GFR, Est Non African American  Date Value Ref Range Status  07/09/2015 >89 >=60 mL/min Final    Comment:      The estimated GFR is a calculation valid for adults (>=29 years old) that uses the CKD-EPI algorithm to adjust for age and sex. It is   not to be used for children, pregnant women, hospitalized patients,    patients on dialysis, or with rapidly changing kidney function. According to the NKDEP, eGFR >89 is normal, 60-89 shows mild impairment, 30-59 shows moderate impairment, 15-29 shows severe impairment and <15 is ESRD.      GFR calc non Af Amer  Date Value Ref Range Status  10/06/2019 107 >59 mL/min/1.73 Final   eGFR  Date Value Ref Range Status  12/15/2022 108 >59 mL/min/1.73 Final         Failed - Cr in normal range and within 360 days    Creat  Date Value Ref Range Status  07/09/2015 0.65 0.50 - 1.10 mg/dL Final   Creatinine, Ser  Date Value Ref Range Status  12/15/2022 0.64 0.57 - 1.00 mg/dL Final   Creatinine, POC  Date Value Ref Range Status  12/17/2016 200 mg/dL Final         Failed - Last BP in normal range    BP Readings from Last 1 Encounters:  04/18/24 (!) 134/107         Failed - Valid encounter within last 6 months    Recent Outpatient Visits           10 months ago Acute conjunctivitis of both eyes, unspecified acute conjunctivitis type    [place] [facility] Celestia Rosaline SQUIBB, NP   10 months ago Class 2 severe obesity due to excess calories with serious comorbidity and body mass index (BMI) of 38.0 to 38.9 in adult   [facility] [place] - A Dept Of [place]. [facility] Vicci Sober B, MD   1 year ago Class 2 severe obesity with serious comorbidity and body mass index (BMI) of 37.0 to 37.9 in adult, unspecified obesity type   [place] [facility] - A Dept Of [place]. [facility] Vicci Sober NOVAK, MD   1 year ago Obesity (BMI 35.0-39.9 without comorbidity)   [place] [facility] - A Dept Of [place]. [facility] Waldron Pac, FNP   2 years ago Vitamin D deficiency disease   [place] [facility] - A Dept Of . [facility], Jon M, PA-C              Failed - Weight completed in the last 3 months    Wt Readings from Last 1 Encounters:  03/15/24 220 lb (99.8  kg)         Refused Prescriptions Disp Refills   hydrochlorothiazide  (HYDRODIURIL ) 12.5 MG tablet 60 tablet 0    Sig: TAKE 2 TABLETS(25 MG) BY MOUTH DAILY. Must have office visit for refills     Cardiovascular: Diuretics - Thiazide Failed - 06/27/2024  3:39 PM      Failed - Cr in normal range and within 180 days    Creat  Date Value Ref Range Status  07/09/2015 0.65 0.50 - 1.10 mg/dL Final   Creatinine, Ser  Date Value Ref Range Status  12/15/2022 0.64 0.57 - 1.00 mg/dL Final   Creatinine, POC  Date Value Ref Range Status  12/17/2016 200 mg/dL Final         Failed - K in normal range and within 180 days    Potassium  Date Value Ref Range Status  12/15/2022 4.3 3.5 - 5.2 mmol/L Final         Failed - Na in normal range and within 180 days    Sodium  Date Value Ref Range Status  12/15/2022 140 134 - 144 mmol/L Final         Failed - Last BP in normal range    BP Readings from Last 1 Encounters:  04/18/24 (!) 134/107          Failed - Valid encounter within last 6 months    Recent Outpatient Visits           10 months ago Acute conjunctivitis of both eyes, unspecified acute conjunctivitis type   [place] [facility] Celestia Rosaline SQUIBB, NP   10 months ago Class 2 severe obesity due to excess calories with serious comorbidity and body mass index (BMI) of 38.0 to 38.9 in adult   [facility] [place] - A Dept Of [place]. [facility] Vicci Sober B, MD   1 year ago Class 2 severe obesity with serious comorbidity and body mass index (BMI) of 37.0 to 37.9 in adult, unspecified obesity type   [place] [facility] [place] - A Dept Of [place]. [facility] Vicci Sober NOVAK, MD   1 year ago Obesity (BMI 35.0-39.9 without comorbidity)   [place] [facility] - A Dept Of [place]. [facility] Waldron Pac, FNP   2 years ago Vitamin D deficiency disease   [place] [facility] - A Dept Of [place]. [facility] [place], [place], PA-C

## 2024-06-27 NOTE — Telephone Encounter (Signed)
 Requested medications are due for refill today.  yes  Requested medications are on the active medications list.  yes  Last refill. 05/03/2024  Future visit scheduled.   yes  Notes to clinic.  Refill not delegated.    Requested Prescriptions  Pending Prescriptions Disp Refills   phentermine  37.5 MG capsule 30 capsule 1    Sig: Take 1 capsule (37.5 mg total) by mouth daily. Needs to be seen prior to next refill request.     Not Delegated - Neurology: Anticonvulsants - Controlled - phentermine  hydrochloride Failed - 06/27/2024  3:40 PM      Failed - This refill cannot be delegated      Failed - eGFR in normal range and within 360 days    GFR, Est African American  Date Value Ref Range Status  07/09/2015 >89 >=60 mL/min Final   GFR calc Af Amer  Date Value Ref Range Status  10/06/2019 123 >59 mL/min/1.73 Final   GFR, Est Non African American  Date Value Ref Range Status  07/09/2015 >89 >=60 mL/min Final    Comment:      The estimated GFR is a calculation valid for adults (>=52 years old) that uses the CKD-EPI algorithm to adjust for age and sex. It is   not to be used for children, pregnant women, hospitalized patients,    patients on dialysis, or with rapidly changing kidney function. According to the NKDEP, eGFR >89 is normal, 60-89 shows mild impairment, 30-59 shows moderate impairment, 15-29 shows severe impairment and <15 is ESRD.      GFR calc non Af Amer  Date Value Ref Range Status  10/06/2019 107 >59 mL/min/1.73 Final   eGFR  Date Value Ref Range Status  12/15/2022 108 >59 mL/min/1.73 Final         Failed - Cr in normal range and within 360 days    Creat  Date Value Ref Range Status  07/09/2015 0.65 0.50 - 1.10 mg/dL Final   Creatinine, Ser  Date Value Ref Range Status  12/15/2022 0.64 0.57 - 1.00 mg/dL Final   Creatinine, POC  Date Value Ref Range Status  12/17/2016 200 mg/dL Final         Failed - Last BP in normal range    BP Readings from  Last 1 Encounters:  04/18/24 (!) 134/107         Failed - Valid encounter within last 6 months    Recent Outpatient Visits           10 months ago Acute conjunctivitis of both eyes, unspecified acute conjunctivitis type   [place] Celestia Rosaline SQUIBB, NP   10 months ago Class 2 severe obesity due to excess calories with serious comorbidity and body mass index (BMI) of 38.0 to 38.9 in adult   [facility] [place] - A Dept Of [place]. [facility] Vicci Sober B, MD   1 year ago Class 2 severe obesity with serious comorbidity and body mass index (BMI) of 37.0 to 37.9 in adult, unspecified obesity type   [place] [facility] - A Dept Of [place]. [facility] Vicci Sober NOVAK, MD   1 year ago Obesity (BMI 35.0-39.9 without comorbidity)   [place] [facility] - A Dept Of [place]. [facility] Waldron Pac, FNP   2 years ago Vitamin D deficiency disease   [place] [facility] - A Dept Of [place]. [facility], Jon M, [place]              Failed - Weight completed in the last 3 months    Wt Readings from Last 1 Encounters:  03/15/24 220 lb (99.8 kg)         Refused Prescriptions Disp Refills   hydrochlorothiazide  (HYDRODIURIL ) 12.5 MG tablet 60 tablet 0    Sig: TAKE 2 TABLETS(25 MG) BY MOUTH DAILY. Must have office visit for refills     Cardiovascular: Diuretics - Thiazide Failed - 06/27/2024  3:40 PM      Failed - Cr in normal range and within 180 days    Creat  Date Value Ref Range Status  07/09/2015 0.65 0.50 - 1.10 mg/dL Final   Creatinine, Ser  Date Value Ref Range Status  12/15/2022 0.64 0.57 - 1.00 mg/dL Final   Creatinine, POC  Date Value Ref Range Status  12/17/2016 200 mg/dL Final         Failed - K in normal range and within 180 days    Potassium  Date Value Ref Range Status  12/15/2022 4.3 3.5 - 5.2 mmol/L Final         Failed  - Na in normal range and within 180 days    Sodium  Date Value Ref Range Status  12/15/2022 140 134 - 144 mmol/L Final         Failed - Last BP in normal range    BP Readings from Last 1 Encounters:  04/18/24 (!) 134/107         Failed - Valid encounter within last 6 months    Recent Outpatient Visits           10 months ago Acute conjunctivitis of both eyes, unspecified acute conjunctivitis type   [place] [facility] Celestia Rosaline SQUIBB, NP   10 months ago Class 2 severe obesity due to excess calories with serious comorbidity and body mass index (BMI) of 38.0 to 38.9 in adult   [facility] [place] - A Dept Of [place]. [facility] Vicci Sober B, MD   1 year ago Class 2 severe obesity with serious comorbidity and body mass index (BMI) of 37.0 to 37.9 in adult, unspecified obesity type   [place] [facility] - A Dept Of [place]. [facility] Vicci Sober NOVAK, MD   1 year ago Obesity (BMI 35.0-39.9 without comorbidity)   [place] [facility] - A Dept Of [place]. [facility] Waldron Pac, FNP   2 years ago Vitamin D deficiency disease   [place] [facility] - A Dept Of [place]. [facility], [place], PA-C

## 2024-06-28 ENCOUNTER — Telehealth: Payer: Self-pay | Admitting: Internal Medicine

## 2024-06-28 MED ORDER — PHENTERMINE HCL 37.5 MG PO CAPS: 37.5000 mg | ORAL_CAPSULE | Freq: Every day | ORAL | 0 refills | Status: DC

## 2024-06-28 NOTE — Telephone Encounter (Signed)
 Pt has been scheduled with Erin Jenkins Houseman NP for 07/06/24.  Need to have pt scheduled with me as I have not seen her in about a year. Please schedule with me provided we can get her in before end of February. If not, keep appt wiith NP.

## 2024-06-30 NOTE — Telephone Encounter (Signed)
 Noted. No sooner appts with PCP at this time.

## 2024-07-05 ENCOUNTER — Telehealth: Payer: Self-pay | Admitting: Internal Medicine

## 2024-07-05 NOTE — Telephone Encounter (Signed)
 Pt unconfirmed appt (per vr) phone not working

## 2024-07-06 ENCOUNTER — Ambulatory Visit: Payer: Self-pay | Admitting: *Deleted

## 2024-07-07 ENCOUNTER — Ambulatory Visit: Payer: Self-pay | Admitting: Internal Medicine

## 2024-07-12 ENCOUNTER — Telehealth: Payer: Self-pay | Admitting: Internal Medicine

## 2024-07-12 NOTE — Telephone Encounter (Signed)
 Contacted pt confirmed appt (per vr)

## 2024-07-14 ENCOUNTER — Encounter: Payer: Self-pay | Admitting: *Deleted

## 2024-07-14 ENCOUNTER — Ambulatory Visit: Payer: Self-pay | Attending: *Deleted | Admitting: *Deleted

## 2024-07-14 ENCOUNTER — Ambulatory Visit: Payer: Self-pay | Admitting: *Deleted

## 2024-07-14 VITALS — BP 128/89 | HR 83 | Temp 98.1°F | Ht 65.0 in | Wt 232.0 lb

## 2024-07-14 DIAGNOSIS — I10 Essential (primary) hypertension: Secondary | ICD-10-CM

## 2024-07-14 DIAGNOSIS — E66812 Obesity, class 2: Secondary | ICD-10-CM

## 2024-07-14 DIAGNOSIS — R7303 Prediabetes: Secondary | ICD-10-CM

## 2024-07-14 LAB — POCT GLYCOSYLATED HEMOGLOBIN (HGB A1C): HbA1c, POC (prediabetic range): 6.1 % (ref 5.7–6.4)

## 2024-07-14 LAB — GLUCOSE, POCT (MANUAL RESULT ENTRY): POC Glucose: 104 mg/dL — AB (ref 70–99)

## 2024-07-14 MED ORDER — PHENTERMINE HCL 37.5 MG PO CAPS: 37.5000 mg | ORAL_CAPSULE | Freq: Every day | ORAL | 0 refills | Status: AC

## 2024-07-14 MED ORDER — TOPIRAMATE 25 MG PO TABS: 25.0000 mg | ORAL_TABLET | Freq: Every day | ORAL | 0 refills | Status: AC

## 2024-07-14 MED ORDER — HYDROCHLOROTHIAZIDE 12.5 MG PO TABS: ORAL_TABLET | ORAL | 0 refills | Status: AC

## 2024-07-14 NOTE — Patient Instructions (Signed)
 Today we discussed your high blood pressure and obesity with a BMI between 35.0 and 39.9. You have done quite well before on hydrochlorothiazide  12.5 for your blood pressure and phentermine  37.5 and Topamax  25 for your weight loss. Your blood pressure is under good control today at 128/89 with a pulse of 83 and an oxygen level of 97. Because you have been borderline diabetic before we did a A1c and point-of-care test for your blood sugar. You report that you joined the gym which is a good idea. Continue attempts at heart healthy and low-carb diet and exercise daily Call if you have any questions or concerns Keep your appointment with your primary care provider

## 2024-07-14 NOTE — Assessment & Plan Note (Addendum)
 She has history of high blood pressure and tolerates hydrochlorothiazide  12.5 mg daily without symptom of concern.  Her blood pressure was 128/89 with a pulse of 83. She has been struggling more with adhering to a heart healthy diet and exercise daily since the holidays. She is encouraged to continue her attempts. 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

## 2024-07-14 NOTE — Progress Notes (Addendum)
 "   Patient ID: Erin Glenn, female    DOB: 07-05-1970  MRN: [id number]  CC: Medical Management of Chronic Issues (Medication refills.)   Subjective: Erin Glenn is a 54 y.o. female who presents for discussion of hypertension and obesity with a BMI between 35.0 and 39.9 (38.61 today).  Hypertension: She is doing quite well on hydrochlorothiazide  12.5 without side effect or concern. She does struggle to adhere to a heart healthy diet and exercise daily. She recently rejoined the gym.  Obesity with a BMI between 35.0 and 39.9 (38.61 today): She explains that her weight is going up as she has been out of medications (phentermine  37.5 and Topamax  25).  She reports that she has been more stressed and eating at night.  Prediabetes: She has been found to be prediabetic in the past.  Staff ordered point-of-care testing glucose and A1c She denies symptoms of concern such as polyuria, polyphagia, polydipsia   Pertinent past medical history : obesity with a BMI between 35.0 and 39.9 without comorbidity, essential hypertension,    Patient Active Problem List   Diagnosis Date Noted   Dysuria 03/09/2019   Obesity (BMI 35.0-39.9 without comorbidity) 11/29/2018   Essential hypertension 07/13/2015   Fibroid uterus 10/11/2013     Medications Ordered Prior to Encounter[1]  Allergies[2]  Social History   Socioeconomic History   Marital status: Married    Spouse name: Not on file   Number of children: Not on file   Years of education: Not on file   Highest education level: Not on file  Occupational History   Not on file  Tobacco Use   Smoking status: Never   Smokeless tobacco: Never  Substance and Sexual Activity   Alcohol use: No   Drug use: No   Sexual activity: Yes    Birth control/protection: None    Comment: G0 P0  Other Topics Concern   Not on file  Social History Narrative   Not on file   Social Drivers of Health   Tobacco Use: Low Risk (04/18/2024)    Patient History    Smoking Tobacco Use: Never    Smokeless Tobacco Use: Never    Passive Exposure: Not on file  Financial Resource Strain: Not on file  Food Insecurity: No Food Insecurity (03/15/2024)   Epic    Worried About Programme Researcher, Broadcasting/film/video in the Last Year: Never true    Ran Out of Food in the Last Year: Never true  Transportation Needs: No Transportation Needs (03/15/2024)   Epic    Lack of Transportation (Medical): No    Lack of Transportation (Non-Medical): No  Physical Activity: Not on file  Stress: Not on file  Social Connections: Not on file  Intimate Partner Violence: Not At Risk (03/15/2024)   Epic    Fear of Current or Ex-Partner: No    Emotionally Abused: No    Physically Abused: No    Sexually Abused: No  Depression (PHQ2-9): Low Risk (03/15/2024)   Depression (PHQ2-9)    PHQ-2 Score: 0  Alcohol Screen: Not on file  Housing: Low Risk (03/15/2024)   Epic    Unable to Pay for Housing in the Last Year: No    Number of Times Moved in the Last Year: 0    Homeless in the Last Year: No  Utilities: Not At Risk (03/15/2024)   Epic    Threatened with loss of utilities: No  Health Literacy: Not on file    Family History  Problem  Relation Age of Onset   Diabetic kidney disease Mother    Hypertension Mother    Stroke Mother    Heart disease Mother    Hyperlipidemia Mother    Diabetes Mother    Alcoholism Father    Hypertension Father    Cancer - Other Father 30       throat   Diabetes Father    Breast cancer Neg Hx     Past Surgical History:  Procedure Laterality Date   BREAST BIOPSY Left 09/2019   us  bx, ribbon, ATYPICAL LYMPHOID PROLIFERATION   BREAST BIOPSY Left 12/14/2019   us  bx, venus marker, path pending   DILATION AND CURETTAGE OF UTERUS  2019    ROS: Review of Systems Negative except as stated above  PHYSICAL EXAM: BP 128/89 (BP Location: Left Arm, Patient Position: Sitting, Cuff Size: Normal)   Pulse 83   Temp 98.1 F (36.7 C) (Oral)   Ht  5' 5 (1.651 m)   Wt 105.2 kg   LMP 05/16/2016   SpO2 97%   BMI 38.61 kg/m   Physical Exam Vitals and nursing note reviewed.  Cardiovascular:     Rate and Rhythm: Normal rate and regular rhythm.  Pulmonary:     Effort: Pulmonary effort is normal.     Breath sounds: Normal breath sounds.  Abdominal:     General: Abdomen is flat.     Palpations: Abdomen is soft.  Skin:    General: Skin is warm and dry.  Neurological:     Mental Status: She is alert. Mental status is at baseline.          Latest Ref Rng & Units 12/15/2022   11:29 AM 02/05/2021    3:08 PM 10/06/2019   11:29 AM  CMP  Glucose 70 - 99 mg/dL 80  84  86   BUN 6 - 24 mg/dL 8  10  8    Creatinine 0.57 - 1.00 mg/dL 9.35  9.26  9.34   Sodium 134 - 144 mmol/L 140  141  140   Potassium 3.5 - 5.2 mmol/L 4.3  4.0  4.4   Chloride 96 - 106 mmol/L 102  104  102   CO2 20 - 29 mmol/L 26  21  24    Calcium 8.7 - 10.2 mg/dL 9.7  9.7  9.6   Total Protein 6.0 - 8.5 g/dL 7.4  7.6  7.9   Total Bilirubin 0.0 - 1.2 mg/dL 0.4  0.2  0.3   Alkaline Phos 44 - 121 IU/L 88  80  84   AST 0 - 40 IU/L 18  19  19    ALT 0 - 32 IU/L 17  16  13     Lipid Panel     Component Value Date/Time   CHOL 162 12/15/2022 1129   TRIG 62 12/15/2022 1129   HDL 68 12/15/2022 1129   CHOLHDL 2.4 12/15/2022 1129   LDLCALC 82 12/15/2022 1129    CBC    Component Value Date/Time   WBC 6.6 12/15/2022 1129   WBC 8.6 07/09/2015 2118   RBC 4.95 12/15/2022 1129   RBC 4.81 07/09/2015 2118   HGB 13.2 12/15/2022 1129   HCT 39.9 12/15/2022 1129   PLT 327 12/15/2022 1129   MCV 81 12/15/2022 1129   MCH 26.7 12/15/2022 1129   MCH 25.3 (A) 07/09/2015 2118   MCHC 33.1 12/15/2022 1129   MCHC 33.8 07/09/2015 2118   RDW 14.6 12/15/2022 1129   LYMPHSABS 1.8 09/21/2017 1045  EOSABS 0.2 09/21/2017 1045   BASOSABS 0.0 09/21/2017 1045    Results for orders placed or performed in visit on 07/14/24  POCT glycosylated hemoglobin (Hb A1C)   Collection Time:  07/14/24 10:27 AM  Result Value Ref Range   Hemoglobin A1C     HbA1c POC (<> result, manual entry)     HbA1c, POC (prediabetic range) 6.1 5.7 - 6.4 %   HbA1c, POC (controlled diabetic range)    POCT glucose (manual entry)   Collection Time: 07/14/24 10:27 AM  Result Value Ref Range   POC Glucose 104 (A) 70 - 99 mg/dl     ASSESSMENT AND PLAN:  Assessment & Plan Prediabetes As above, patient has had a history of prediabetes with an A1c of 5.8 1 year ago. Per clinic protocol staff ordered point-of-care hemoglobin A1c and glucose. Glucose was 104. A1c result was not available for the patient left the clinic to go to work. These results Erin be reviewed by myself and  sent to the patient. She is encouraged to adhere to a low carbohydrate diet and exercise daily  Orders:   POCT glycosylated hemoglobin (Hb A1C)   POCT glucose (manual entry)  Essential hypertension She has history of high blood pressure and tolerates hydrochlorothiazide  12.5 mg daily without symptom of concern.  Her blood pressure was 128/89 with a pulse of 83. She has been struggling more with adhering to a heart healthy diet and exercise daily since the holidays. She is encouraged to continue her attempts. 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 She has been stressed over the holidays and has been eating more at night. She was not interested in behavioral therapy or a referral to nutrition and healthy weight clinic at this time. She Erin consider it and let me know. She was interested in refills of her phentermine  and Topamax  which I okayed x 1(PDMP reviewed) She is encouraged to continue a  counting calorie diet and exercise daily. She Erin call the clinic if she has any concerns or questions. She Erin keep her appointment with her primary provider for follow-up Orders:   phentermine  37.5 MG capsule; Take 1 capsule (37.5 mg total) by mouth daily. Needs to be seen prior to next refill request.   topiramate  (TOPAMAX ) 25 MG tablet; Take 1 tablet (25 mg total) by mouth at bedtime. Needs appointment prior to next refill request.       Patient was given the opportunity to ask questions.  Patient verbalized understanding of the plan and was able to repeat key elements of the plan.   This documentation was completed using Paediatric nurse.  Any transcriptional errors are unintentional.     Requested Prescriptions   Signed Prescriptions Disp Refills   hydrochlorothiazide  (HYDRODIURIL ) 12.5 MG tablet 60 tablet 0    Sig: TAKE 2 TABLETS(25 MG) BY MOUTH DAILY   phentermine  37.5 MG capsule 30 capsule 0    Sig: Take 1 capsule (37.5 mg total) by mouth daily. Needs to be seen prior to next refill request.   topiramate  (TOPAMAX ) 25 MG tablet 30 tablet 0    Sig: Take 1 tablet (25 mg total) by mouth  at bedtime. Needs appointment prior to next refill request.    No follow-ups on file.  Finnley [place] H, NP      [1]  Current Outpatient Medications on File Prior to Visit  Medication Sig Dispense Refill   meloxicam  (MOBIC ) 15 MG tablet TAKE 1 TABLET(15 MG) BY MOUTH DAILY AS NEEDED FOR PAIN 90 tablet 0   albuterol  (VENTOLIN  HFA) 108 (90 Base) MCG/ACT inhaler Inhale 1-2 puffs into the lungs every 6 (six) hours as needed for wheezing or shortness of breath. (Patient not taking: Reported on 07/14/2024) 18 g 0   amoxicillin -clavulanate (AUGMENTIN ) 875-125 MG tablet Take 1 tablet by mouth every 12 (twelve) hours. (Patient not taking: Reported on 07/14/2024) 14 tablet 0   azithromycin  (ZITHROMAX ) 250 MG tablet Take 1 tablet (250 mg total) by mouth daily. Take first 2 tablets  together, then 1 every day until finished. (Patient not taking: Reported on 07/14/2024) 6 tablet 0   benzonatate  (TESSALON ) 100 MG capsule Take 1 capsule (100 mg total) by mouth every 8 (eight) hours. (Patient not taking: Reported on 07/14/2024) 21 capsule 0   cetirizine  (ZYRTEC  ALLERGY) 10 MG tablet Take 1 tablet (10 mg total) by mouth daily. (Patient not taking: Reported on 07/14/2024) 30 tablet 11   fluticasone  (FLONASE ) 50 MCG/ACT nasal spray Place 2 sprays into both nostrils daily. (Patient not taking: Reported on 07/14/2024) 16 g 6   ofloxacin  (OCUFLOX ) 0.3 % ophthalmic solution Place 1 drop into the right eye 4 (four) times daily. (Patient not taking: Reported on 07/14/2024) 5 mL 0   No current facility-administered medications on file prior to visit.  [2]  Allergies Allergen Reactions   Hydrocodone -Acetaminophen  Nausea Only   Hydrocodone -Acetaminophen  Nausea Only   "
